# Patient Record
Sex: Female | Born: 2000 | Race: White | Hispanic: No | Marital: Single | Smoking: Never smoker
Health system: Southern US, Community
[De-identification: ages and names within clinical notes are randomized; demographics above are authoritative.]

## PROBLEM LIST (undated history)

## (undated) DIAGNOSIS — F419 Anxiety disorder, unspecified: Secondary | ICD-10-CM

---
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
## 2020-01-12 ENCOUNTER — Ambulatory Visit
Admission: EM | Admit: 2020-01-12 | Discharge: 2020-01-12 | Disposition: A | Discharge status: Home / Self Care | Payer: BC Managed Care – PPO | Attending: Sports Medicine | Admitting: Sports Medicine

## 2020-01-12 ENCOUNTER — Other Ambulatory Visit: Payer: Self-pay

## 2020-01-12 ENCOUNTER — Encounter: Payer: Self-pay | Admitting: Emergency Medicine

## 2020-01-12 ENCOUNTER — Ambulatory Visit: Payer: Self-pay

## 2020-01-12 DIAGNOSIS — T7840XA Allergy, unspecified, initial encounter: Secondary | ICD-10-CM | POA: Diagnosis not present

## 2020-01-12 DIAGNOSIS — R21 Rash and other nonspecific skin eruption: Secondary | ICD-10-CM

## 2020-01-12 HISTORY — DX: Anxiety disorder, unspecified: F41.9

## 2020-01-12 MED ORDER — PREDNISONE 10 MG (21) PO TBPK: ORAL_TABLET | Freq: Every day | ORAL | 0 refills | Status: DC

## 2020-01-12 MED ORDER — CETIRIZINE HCL 10 MG PO TABS: 10.0000 mg | ORAL_TABLET | Freq: Every day | ORAL | 0 refills | Status: DC

## 2020-01-12 NOTE — ED Triage Notes (Signed)
Patient c/o itchy rash that started on Sunday. Denies any other symptoms. States she has been taking Benadryl.

## 2020-01-12 NOTE — ED Provider Notes (Signed)
[facility]    CSN: [id number] Arrival date & time: 01/12/20  1846      History   Chief Complaint Chief Complaint  Patient presents with  . Rash    HPI Judith Marquez is a 19 y.o. female.   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  She reports that she has had a rash all over her body but worse on her arms and her legs.  It has been responding to Benadryl in terms of the itchiness.  That said the rash has not improved.  She denies any dietary changes.  She denies any food allergies.  She denies any changes in detergents soaps or perfumes.  She does have some baseline anxiety and takes sertraline but no new medications.  She denies any throat tightening chest pain or shortness of breath.  No red flag signs or symptoms offered.     Past Medical History:  Diagnosis Date  . Anxiety     There are no problems to display for this patient.   History reviewed. No pertinent surgical history.  OB History   No obstetric history on file.      Home Medications    Prior to Admission medications   Medication Sig Start Date End Date Taking? Authorizing Provider  albuterol (PROVENTIL HFA;VENTOLIN HFA) 108 (90 BASE) MCG/ACT inhaler Inhale 2 puffs into the lungs every 6 (six) hours as needed for wheezing or shortness of breath. 06/10/14   Minna Antis, MD  cetirizine (ZYRTEC ALLERGY) 10 MG tablet Take 1 tablet (10 mg total) by mouth daily. 01/12/20   Delton See, MD  predniSONE (STERAPRED UNI-PAK 21 TAB) 10 MG (21) TBPK tablet Take by mouth daily. Take 6 tabs by mouth daily  for 2 days, then 5 tabs for 2 days, then 4 tabs for 2 days, then 3 tabs for 2 days, 2 tabs for 2 days, then 1 tab by mouth daily for 2 days 01/12/20   Delton See, MD  sertraline (ZOLOFT) 50 MG tablet Take 50 mg by mouth daily.    [provider]     Family History History reviewed. No pertinent family history.  Social History Social History   Tobacco Use  . Smoking status: Never Smoker  . Smokeless tobacco: Never Used  Vaping Use  . Vaping Use: Never used  Substance Use Topics  . Alcohol use: No  . Drug use: Never     Allergies   Patient has no known allergies.   Review of Systems Review of Systems  Constitutional: Negative for activity change, appetite change, chills, fatigue and fever.  HENT: Negative for ear discharge, ear pain and sinus pressure.   Eyes: Negative for pain.  Respiratory: Negative for cough and chest tightness.   Cardiovascular: Negative for chest pain.  Gastrointestinal: Negative for abdominal pain.  Genitourinary: Negative for dysuria.  Skin: Positive for rash. Negative for color change, pallor and wound.  Allergic/Immunologic: Negative for environmental allergies, food allergies and immunocompromised state.     Physical Exam Triage Vital Signs ED Triage Vitals  Enc Vitals Group     BP 01/12/20 1929 (!) 102/53     Pulse Rate 01/12/20 1929 66     Resp 01/12/20 1929 18     Temp 01/12/20 1929 98.1 F (36.7 C)     Temp Source 01/12/20 1929 Oral  SpO2 01/12/20 1929 100 %     Weight 01/12/20 1927 146 lb (66.2 kg)     Height 01/12/20 1927 5\' 9"  (1.753 m)     Head Circumference --      Peak Flow --      Pain Score 01/12/20 1927 0     Pain Loc --      Pain Edu? --      Excl. in GC? --    No data found.  Updated Vital Signs BP (!) 102/53 (BP Location: Left Arm)   Pulse 66   Temp 98.1 F (36.7 C) (Oral)   Resp 18   Ht 5\' 9"  (1.753 m)   Wt 66.2 kg   LMP 01/11/2020   SpO2 100%   BMI 21.56 kg/m   Visual Acuity Right Eye Distance:   Left Eye Distance:   Bilateral Distance:    Right Eye Near:   Left Eye Near:    Bilateral Near:     Physical Exam Vitals and nursing note reviewed.  Constitutional:      General: She is not in acute distress.    Appearance: Normal  appearance. She is normal weight. She is not ill-appearing, toxic-appearing or diaphoretic.  HENT:     Head: Normocephalic and atraumatic.     Nose: Nose normal.  Eyes:     Extraocular Movements: Extraocular movements intact.     Pupils: Pupils are equal, round, and reactive to light.  Cardiovascular:     Rate and Rhythm: Normal rate.     Pulses: Normal pulses.     Heart sounds: Normal heart sounds. No murmur heard. No friction rub. No gallop.   Pulmonary:     Breath sounds: Normal breath sounds.  Musculoskeletal:     Cervical back: Normal range of motion and neck supple.  Skin:    General: Skin is warm and dry.     Coloration: Skin is not jaundiced or pale.     Findings: Rash present. No bruising.     Comments: Patient has a rash on the upper and lower extremities.  She has associated excoriations.  Seems consistent with allergic reaction.  No active infection or drainage.  Neurological:     Mental Status: She is alert.      UC Treatments / Results  Labs (all labs ordered are listed, but only abnormal results are displayed) Labs Reviewed - No data to display  EKG   Radiology No results found.  Procedures Procedures (including critical care time)  Medications Ordered in UC Medications - No data to display  Initial Impression / Assessment and Plan / UC Course  I have reviewed the triage vital signs and the nursing notes.  Pertinent labs & imaging results that were available during my care of the patient were reviewed by me and considered in my medical decision making (see chart for details).  Clinical impression: Global rash mostly on the upper and lower extremities consistent with an allergic reaction.  She also has excoriations from pruritus and scratching.  No active infection.  Etiology not known.  Treatment plan: 1.  The findings and treatment plan were discussed in detail with the patient.  Patient was in agreement. 2.  I recommended a Sterapred Dosepak.  She  will start that tomorrow as it is fairly late in the evening. 3.  Also recommended Zyrtec gave her prescription but it may not be covered by her insurance that she get that over-the-counter. 4.  Continue with the Benadryl for  the itchiness.  She can also use calamine lotion, oatmeal, or Benadryl cream. 5.  Of asked her to keep a food log as well as a log of any detergents, soaps, etc. just in case this does not improve and she needs to see an allergist or dermatology. 6.  Follow-up as needed.     Final Clinical Impressions(s) / UC Diagnoses   Final diagnoses:  Rash  Allergic reaction, initial encounter     Discharge Instructions     I recommended a Sterapred Dosepak.  She will start that tomorrow as it is fairly late in the evening. Also recommended Zyrtec gave her prescription but it may not be covered by her insurance that she get that over-the-counter. Continue with the Benadryl for the itchiness.  She can also use calamine lotion, oatmeal, or Benadryl cream. Of asked her to keep a food log as well as a log of any detergents, soaps, etc. just in case this does not improve and she needs to see an allergist or dermatology. Follow-up as needed.    ED Prescriptions    Medication Sig Dispense Auth. Provider   predniSONE (STERAPRED UNI-PAK 21 TAB) 10 MG (21) TBPK tablet Take by mouth daily. Take 6 tabs by mouth daily  for 2 days, then 5 tabs for 2 days, then 4 tabs for 2 days, then 3 tabs for 2 days, 2 tabs for 2 days, then 1 tab by mouth daily for 2 days 42 tablet Delton See, MD   cetirizine (ZYRTEC ALLERGY) 10 MG tablet Take 1 tablet (10 mg total) by mouth daily. 30 tablet Delton See, MD     PDMP not reviewed this encounter.   Delton See, MD 01/12/20 2100

## 2020-01-12 NOTE — Discharge Instructions (Addendum)
I recommended a Sterapred Dosepak.  She will start that tomorrow as it is fairly late in the evening. Also recommended Zyrtec gave her prescription but it may not be covered by her insurance that she get that over-the-counter. Continue with the Benadryl for the itchiness.  She can also use calamine lotion, oatmeal, or Benadryl cream. Of asked her to keep a food log as well as a log of any detergents, soaps, etc. just in case this does not improve and she needs to see an allergist or dermatology. Follow-up as needed.

## 2020-02-29 ENCOUNTER — Encounter: Payer: Self-pay | Admitting: Emergency Medicine

## 2020-08-13 IMAGING — CR DG FOOT COMPLETE 3+V*R*
3 series · 3 of 3 positions shown · non-contrast
Comparison: None.

CLINICAL DATA: Right lateral and plantar foot pain for the past 4
days. The patient thinks that this may be running related.

EXAM:
RIGHT FOOT COMPLETE - 3+ VIEW

[foot ap]
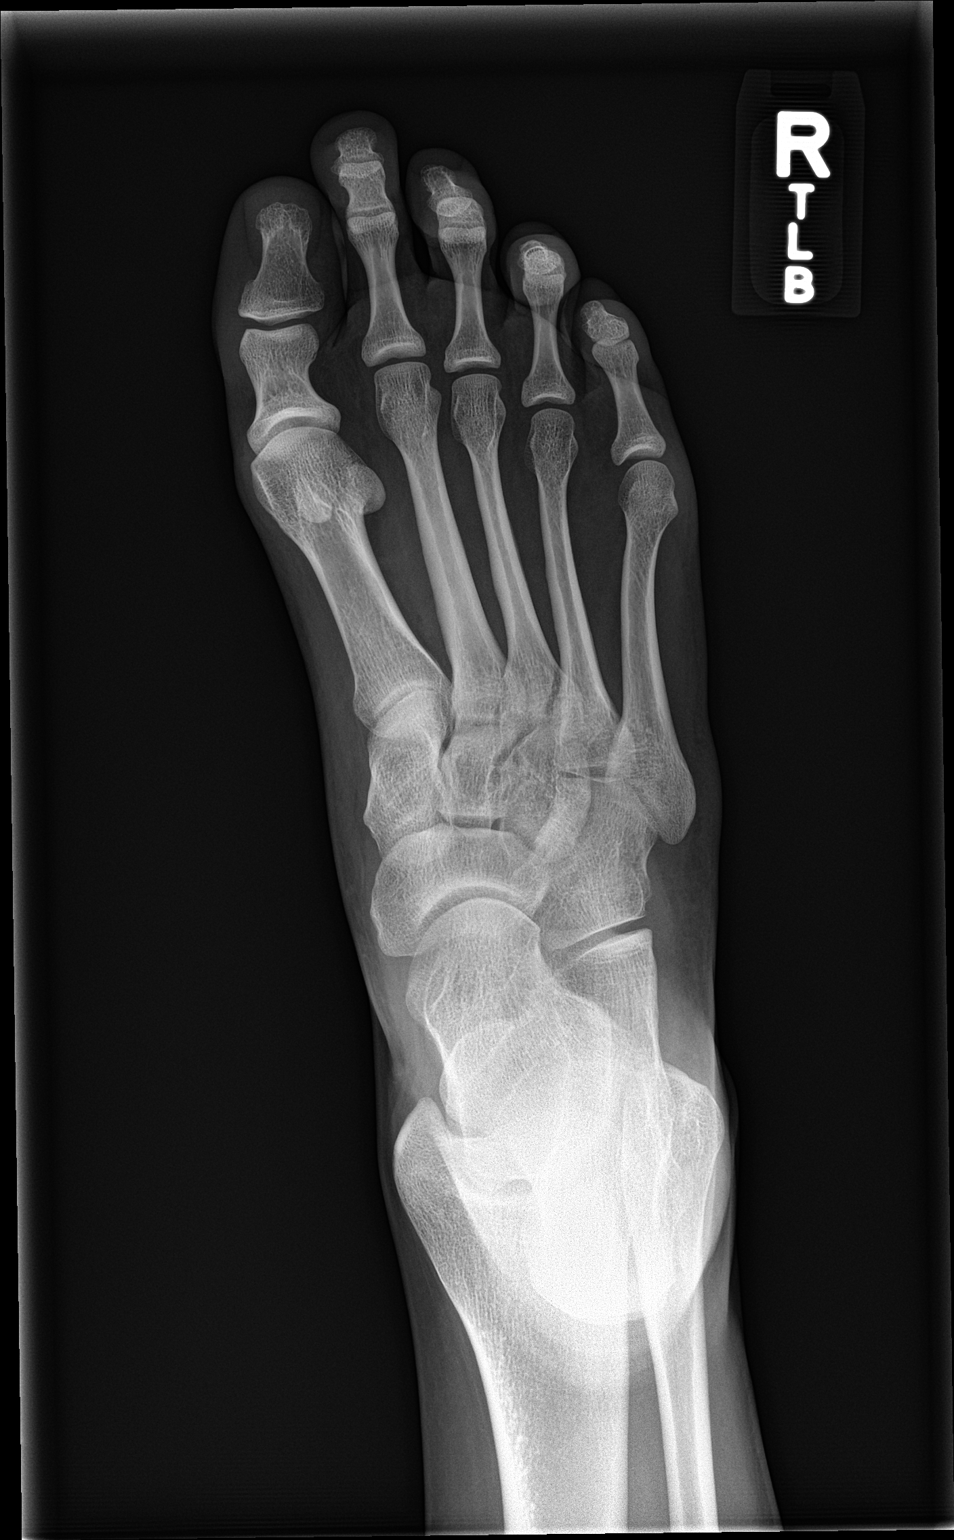

[foot obl]
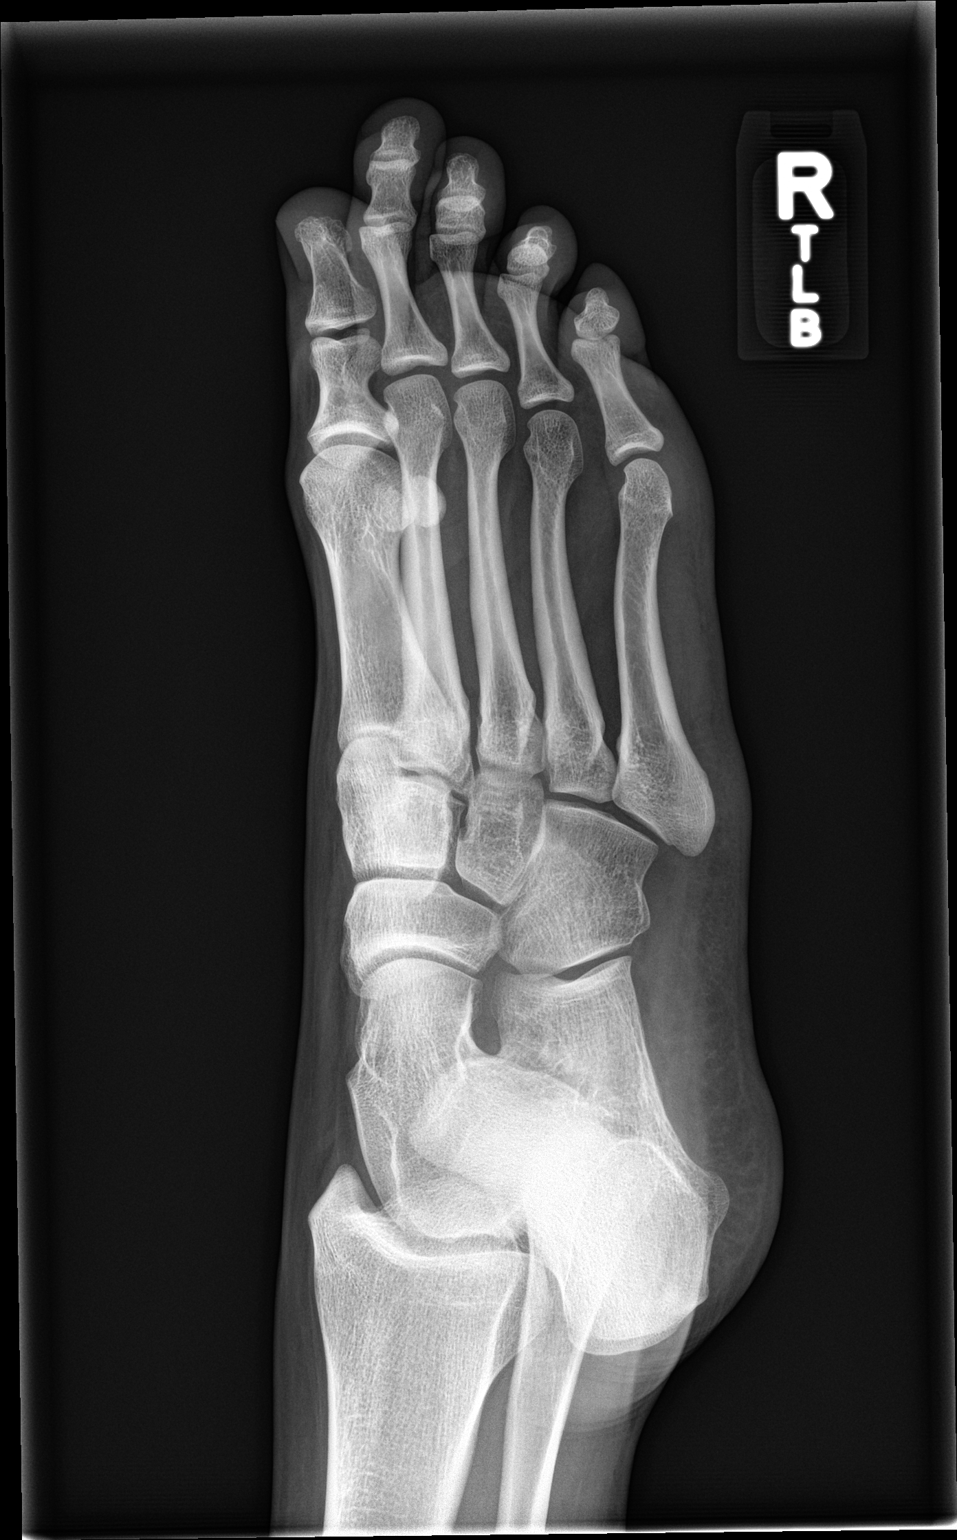

[foot lat]
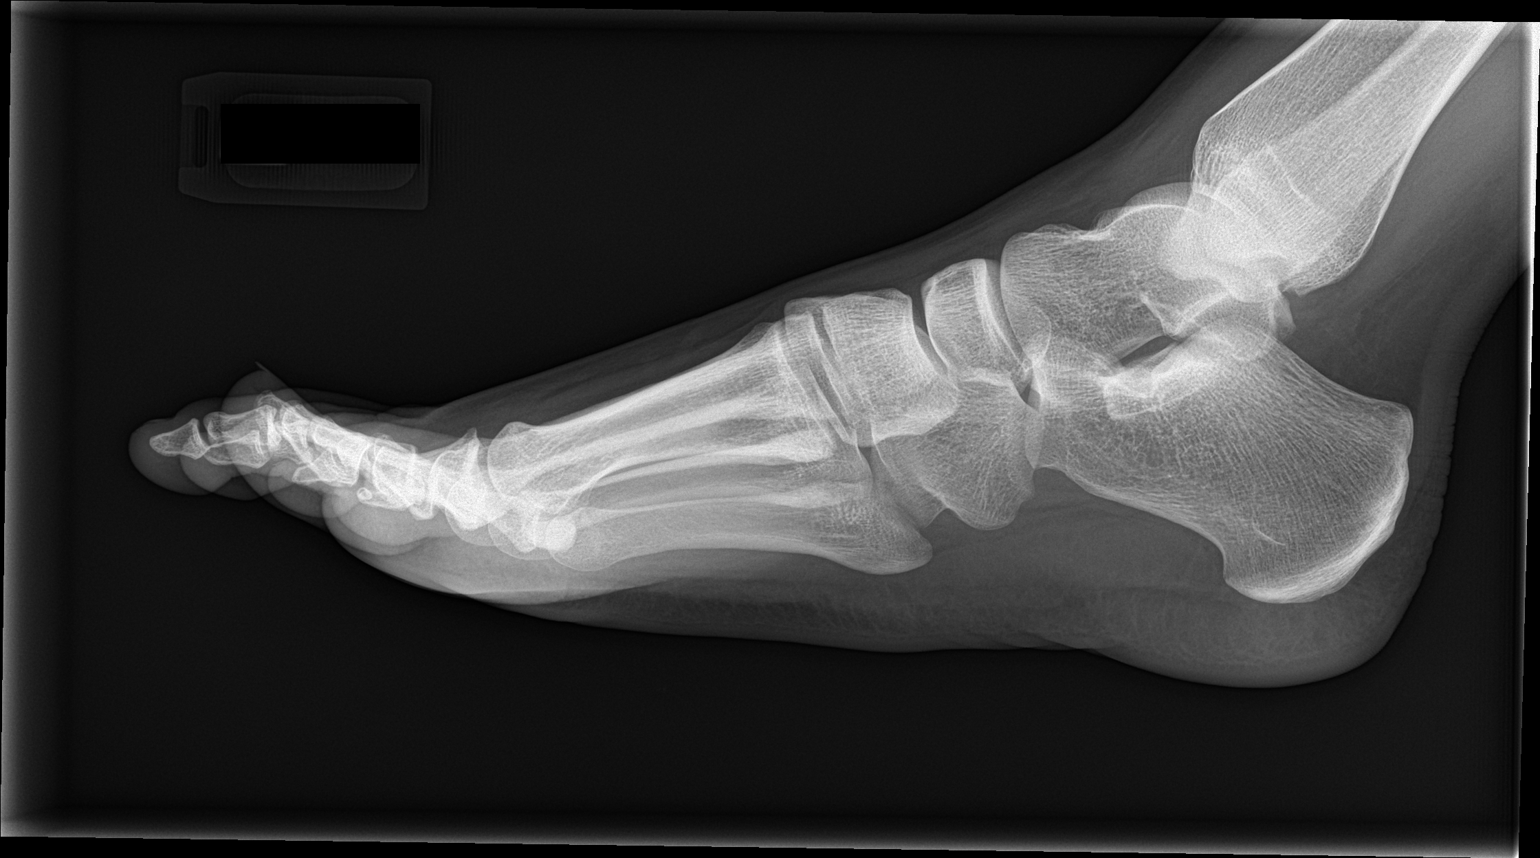

[3 of 3 positions shown; findings below may reference images not displayed]

FINDINGS: There is no evidence of fracture or dislocation. There is no
evidence of arthropathy or other focal bone abnormality. Soft
tissues are unremarkable.
IMPRESSION: Normal examination.

## 2020-12-08 ENCOUNTER — Ambulatory Visit: Admit: 2020-12-08 | Payer: BC Managed Care – PPO

## 2020-12-12 ENCOUNTER — Other Ambulatory Visit: Payer: Self-pay

## 2020-12-12 ENCOUNTER — Ambulatory Visit
Admission: EM | Admit: 2020-12-12 | Discharge: 2020-12-12 | Disposition: A | Discharge status: Home / Self Care | Payer: BC Managed Care – PPO | Attending: Physician Assistant | Admitting: Physician Assistant

## 2020-12-12 DIAGNOSIS — R051 Acute cough: Secondary | ICD-10-CM | POA: Insufficient documentation

## 2020-12-12 DIAGNOSIS — Z20822 Contact with and (suspected) exposure to covid-19: Secondary | ICD-10-CM | POA: Insufficient documentation

## 2020-12-12 DIAGNOSIS — J029 Acute pharyngitis, unspecified: Secondary | ICD-10-CM

## 2020-12-12 DIAGNOSIS — J101 Influenza due to other identified influenza virus with other respiratory manifestations: Secondary | ICD-10-CM | POA: Insufficient documentation

## 2020-12-12 LAB — GROUP A STREP BY PCR: Group A Strep by PCR: NOT DETECTED

## 2020-12-12 LAB — RESP PANEL BY RT-PCR (FLU A&B, COVID) ARPGX2
Influenza A by PCR: POSITIVE — AB
Influenza B by PCR: NEGATIVE
SARS Coronavirus 2 by RT PCR: NEGATIVE

## 2020-12-12 MED ORDER — OSELTAMIVIR PHOSPHATE 75 MG PO CAPS: 75.0000 mg | ORAL_CAPSULE | Freq: Two times a day (BID) | ORAL | 0 refills | Status: AC

## 2020-12-12 MED ORDER — LIDOCAINE VISCOUS HCL 2 % MT SOLN: 15.0000 mL | OROMUCOSAL | 0 refills | Status: AC | PRN

## 2020-12-12 NOTE — ED Triage Notes (Addendum)
Pt states started with scratchy throat on Saturday and progressively got worse to where she feels sharp pain in throat all the time. Wants strep test and flu. Started with cough and runny nose started on Sunday. Temp has been 99.7 range. Negative COVID test at home

## 2020-12-12 NOTE — Discharge Instructions (Signed)
-  You are positive for the flu.  I sent Tamiflu to pharmacy.  Hopefully this improves her symptoms and shorten the course by a day or so. - Care is still supported with increasing rest and fluids, Tylenol or Motrin for discomfort.  I sent viscous lidocaine to pharmacy to help with the sore throat.  You can also use you Chloraseptic spray and cough drops.  Robitussin or Mucinex for cough if needed. - Most people feel better within a week or so. - For any severe acute symptoms that are worsening should be seen again.

## 2020-12-12 NOTE — ED Provider Notes (Signed)
[facility]    CSN: [id number] Arrival date & time: 12/12/20  1015      History   Chief Complaint Chief Complaint  Patient presents with   Sore Throat    HPI Judith Marquez is a 20 y.o. female presenting for 2-day history of sore throat, congestion, cough, fatigue, body aches and low-grade temps.  Patient says she believes she may have a cold but wants to get checked out.  Has been taking over-the-counter Tylenol and Motrin.  Patient was bothered by her sore throat.  Denies any sick contacts or known exposure to influenza or COVID-19.  Does not report any breathing difficulty, nausea/vomiting or diarrhea.  Patient otherwise healthy.  No other complaints.  HPI  Past Medical History:  Diagnosis Date   Anxiety     There are no problems to display for this patient.   History reviewed. No pertinent surgical history.  OB History   No obstetric history on file.      Home Medications    Prior to Admission medications   Medication Sig Start Date End Date Taking? Authorizing Provider  lidocaine (XYLOCAINE) 2 % solution Use as directed 15 mLs in the mouth or throat every 3 (three) hours as needed for mouth pain (swish and spit). 12/12/20  Yes Shirlee Latch, PA-C  oseltamivir (TAMIFLU) 75 MG capsule Take 1 capsule (75 mg total) by mouth every 12 (twelve) hours for 5 days. 12/12/20 12/17/20 Yes Eusebio Friendly B, PA-C  albuterol (PROVENTIL HFA;VENTOLIN HFA) 108 (90 BASE) MCG/ACT inhaler Inhale 2 puffs into the lungs every 6 (six) hours as needed for wheezing or shortness of breath. 06/10/14   Minna Antis, MD  busPIRone (BUSPAR) 10 MG tablet Take 10 mg by mouth 2 (two) times daily. 08/02/20   [provider]  sertraline (ZOLOFT) 50 MG tablet Take 50 mg by mouth daily.    [provider]    Family History History reviewed. No pertinent family history.  Social History Social History   Tobacco Use   Smoking status: Never   Smokeless  tobacco: Never  Vaping Use   Vaping Use: Never used  Substance Use Topics   Alcohol use: No   Drug use: Never     Allergies   Amoxicillin   Review of Systems Review of Systems  Constitutional:  Positive for fatigue. Negative for chills, diaphoresis and fever.  HENT:  Positive for congestion, rhinorrhea and sore throat. Negative for ear pain, sinus pressure and sinus pain.   Respiratory:  Positive for cough. Negative for shortness of breath.   Gastrointestinal:  Negative for abdominal pain, nausea and vomiting.  Musculoskeletal:  Positive for myalgias. Negative for arthralgias.  Skin:  Negative for rash.  Neurological:  Negative for weakness and headaches.  Hematological:  Negative for adenopathy.    Physical Exam Triage Vital Signs ED Triage Vitals  Enc Vitals Group     BP 12/12/20 1111 100/71     Pulse Rate 12/12/20 1111 88     Resp 12/12/20 1111 16     Temp 12/12/20 1111 98.2 F (36.8 C)     Temp Source 12/12/20 1111 Oral     SpO2 12/12/20 1111 100 %     Weight 12/12/20 1110 150 lb (68 kg)     Height 12/12/20 1110 5\' 10"  (1.778 m)     Head Circumference --      Peak Flow --      Pain Score 12/12/20 1109 6  Pain Loc --      Pain Edu? --      Excl. in GC? --    No data found.  Updated Vital Signs BP 100/71 (BP Location: Left Arm)   Pulse 88   Temp 98.2 F (36.8 C) (Oral)   Resp 16   Ht 5\' 10"  (1.778 m)   Wt 150 lb (68 kg)   LMP 12/06/2020   SpO2 100%   BMI 21.52 kg/m   Physical Exam Vitals and nursing note reviewed.  Constitutional:      General: She is not in acute distress.    Appearance: Normal appearance. She is well-developed. She is ill-appearing. She is not toxic-appearing.  HENT:     Head: Normocephalic and atraumatic.     Nose: Congestion present.     Mouth/Throat:     Mouth: Mucous membranes are moist.     Pharynx: Oropharynx is clear. Posterior oropharyngeal erythema present.  Eyes:     General: No scleral icterus.       Right  eye: No discharge.        Left eye: No discharge.     Conjunctiva/sclera: Conjunctivae normal.  Cardiovascular:     Rate and Rhythm: Normal rate and regular rhythm.     Heart sounds: Normal heart sounds.  Pulmonary:     Effort: Pulmonary effort is normal. No respiratory distress.     Breath sounds: Normal breath sounds.  Musculoskeletal:     Cervical back: Neck supple.  Skin:    General: Skin is dry.  Neurological:     General: No focal deficit present.     Mental Status: She is alert. Mental status is at baseline.     Motor: No weakness.     Gait: Gait normal.  Psychiatric:        Mood and Affect: Mood normal.        Behavior: Behavior normal.        Thought Content: Thought content normal.     UC Treatments / Results  Labs (all labs ordered are listed, but only abnormal results are displayed) Labs Reviewed  RESP PANEL BY RT-PCR (FLU A&B, COVID) ARPGX2 - Abnormal; Notable for the following components:      Result Value   Influenza A by PCR POSITIVE (*)    All other components within normal limits  GROUP A STREP BY PCR    EKG   Radiology No results found.  Procedures Procedures (including critical care time)  Medications Ordered in UC Medications - No data to display  Initial Impression / Assessment and Plan / UC Course  I have reviewed the triage vital signs and the nursing notes.  Pertinent labs & imaging results that were available during my care of the patient were reviewed by me and considered in my medical decision making (see chart for details).   20 year old female presenting for 2-day history of sore throat, cough, congestion, fatigue, body aches.  Vitals normal and stable today but patient has been taking antipyretics.  She is mildly ill-appearing but nontoxic.  Exam severe for nasal congestion and posterior pharyngeal erythema.  Chest clear auscultation.  Respiratory panel obtained as well as PCR strep test.  Positive for influenza A.  Discussed all  results with patient.  She would like to try Tamiflu so I have sent it to pharmacy.  Additionally I have sent discussed lidocaine.  Supportive care encouraged.  Reviewed return to the ED precautions and note given for work.  Final Clinical Impressions(s) / UC Diagnoses   Final diagnoses:  Influenza A  Sore throat  Acute cough     Discharge Instructions      -You are positive for the flu.  I sent Tamiflu to pharmacy.  Hopefully this improves her symptoms and shorten the course by a day or so. - Care is still supported with increasing rest and fluids, Tylenol or Motrin for discomfort.  I sent viscous lidocaine to pharmacy to help with the sore throat.  You can also use you Chloraseptic spray and cough drops.  Robitussin or Mucinex for cough if needed. - Most people feel better within a week or so. - For any severe acute symptoms that are worsening should be seen again.     ED Prescriptions     Medication Sig Dispense Auth. Provider   oseltamivir (TAMIFLU) 75 MG capsule Take 1 capsule (75 mg total) by mouth every 12 (twelve) hours for 5 days. 10 capsule Eusebio Friendly B, PA-C   lidocaine (XYLOCAINE) 2 % solution Use as directed 15 mLs in the mouth or throat every 3 (three) hours as needed for mouth pain (swish and spit). 100 mL Shirlee Latch, PA-C      PDMP not reviewed this encounter.   Shirlee Latch, PA-C 12/12/20 1226

## 2021-08-18 ENCOUNTER — Telehealth: Payer: BC Managed Care – PPO | Admitting: Physician Assistant

## 2021-08-18 NOTE — Progress Notes (Signed)
Patient has presented for E-Visit but is not in [place].  She will be directed to [facility] or local urgent care.  Jarold Motto PA-C

## 2022-03-18 NOTE — Telephone Encounter (Signed)
Msg left with answering service 2/23 requesting new pt appt.  -lvmcb

## 2023-02-14 ENCOUNTER — Observation Stay
Admission: EM | Admit: 2023-02-14 | Discharge: 2023-02-16 | Disposition: A | Discharge status: Home / Self Care | Payer: PRIVATE HEALTH INSURANCE | Admitting: Hospitalist

## 2023-02-14 ENCOUNTER — Emergency Department: Admit: 2023-02-14 | Payer: PRIVATE HEALTH INSURANCE | Primary: Internal Medicine

## 2023-02-14 DIAGNOSIS — R1031 Right lower quadrant pain: Secondary | ICD-10-CM

## 2023-02-14 DIAGNOSIS — K529 Noninfective gastroenteritis and colitis, unspecified: Secondary | ICD-10-CM

## 2023-02-14 LAB — CBC WITH DIFFERENTIAL
Basophils %: 0.3 %
Basophils Absolute: 0.04 10*3/uL (ref 0.00–0.22)
Eosinophils %: 0.4 %
Eosinophils Absolute: 0.05 10*3/uL (ref 0.00–0.50)
Hematocrit: 39.3 % (ref 32.0–47.0)
Hemoglobin: 13.1 g/dL (ref 11.0–16.0)
Immature Granulocytes %: 0.3 %
Immature Granulocytes Absolute: 0.04 10*3/uL (ref 0.00–0.10)
Lymphocyte %: 13.7 %
Lymphocytes Absolute: 1.82 10*3/uL (ref 0.70–4.00)
MCH: 28.8 pg (ref 26.0–34.0)
MCHC: 33.3 g/dL (ref 31.0–37.0)
MCV: 86.4 fL (ref 80.0–100.0)
MPV: 9.9 fL (ref 9.1–12.4)
Monocytes %: 9.4 %
Monocytes Absolute: 1.25 10*3/uL — ABNORMAL HIGH (ref 0.36–0.77)
NRBC %: 0 % (ref 0.0–0.0)
NRBC Absolute: 0 10*3/uL (ref 0.00–2.00)
Neutrophil %: 75.9 %
Neutrophils Absolute: 10.12 10*3/uL — ABNORMAL HIGH (ref 1.50–7.95)
Platelets: 234 10*3/uL (ref 150–400)
RBC: 4.55 M/uL (ref 3.70–5.20)
RDW-CV: 13.1 % (ref 11.5–14.5)
RDW-SD: 41.1 fL (ref 35.0–51.0)
WBC: 13.3 10*3/uL — ABNORMAL HIGH (ref 4.0–11.0)

## 2023-02-14 LAB — COMPREHENSIVE METABOLIC PANEL
ALT: 23 U/L (ref 0–55)
AST: 21 U/L (ref 6–42)
Albumin: 3.3 g/dL (ref 3.2–5.0)
Alkaline phosphatase: 56 U/L (ref 30–130)
Anion Gap: 9 mmol/L (ref 3–14)
BUN: 7 mg/dL (ref 6–24)
Bilirubin, total: 0.2 mg/dL (ref 0.2–1.2)
CO2 (Bicarbonate): 25 mmol/L (ref 20–32)
Calcium: 9 mg/dL (ref 8.5–10.5)
Chloride: 100 mmol/L (ref 98–110)
Creatinine: 0.69 mg/dL (ref 0.55–1.30)
Glucose: 100 mg/dL (ref 70–139)
Potassium: 4.1 mmol/L (ref 3.6–5.2)
Protein, total: 7 g/dL (ref 6.0–8.4)
Sodium: 134 mmol/L — ABNORMAL LOW (ref 135–146)
eGFRcr: 125 mL/min/{1.73_m2} (ref >=60)

## 2023-02-14 LAB — LIPASE: Lipase: 24 U/L (ref 13–75)

## 2023-02-14 LAB — HCG, QUANTITATIVE, PREGNANCY: hCG, serum, quantitative: <1 m[IU]/mL (ref <=5)

## 2023-02-14 MED ORDER — ketorolac (Toradol) injection 15 mg
15 | Freq: Once | INTRAMUSCULAR | Status: AC
Start: 2023-02-14 — End: 2023-02-14
  Administered 2023-02-15: 01:00:00 15 mg via INTRAVENOUS

## 2023-02-14 MED ORDER — iohexol (OMNIPaque) 350 mg iodine/mL solution 100 mL
350 | Freq: Once | INTRAVENOUS | Status: AC
Start: 2023-02-14 — End: 2023-02-14
  Administered 2023-02-14: 22:00:00 100 mL via INTRAVENOUS

## 2023-02-14 MED ORDER — HYDROmorphone (Dilaudid) injection 0.5 mg
0.5 | Freq: Once | INTRAMUSCULAR | Status: AC
Start: 2023-02-14 — End: 2023-02-14
  Administered 2023-02-14: 23:00:00 0.5 mg via INTRAVENOUS

## 2023-02-14 MED ORDER — sodium chloride 0.9 % bolus 1,000 mL
0.9 | Freq: Once | INTRAVENOUS | Status: AC
Start: 2023-02-14 — End: 2023-02-14
  Administered 2023-02-14: 23:00:00 1000 mL via INTRAVENOUS

## 2023-02-14 MED ORDER — HYDROmorphone (Dilaudid) injection 0.5 mg
0.5 | Freq: Once | INTRAMUSCULAR | Status: AC
Start: 2023-02-14 — End: 2023-02-14
  Administered 2023-02-15: 02:00:00 0.5 mg via INTRAVENOUS

## 2023-02-14 MED ORDER — ondansetron (Zofran) injection 4 mg
4 | Freq: Once | INTRAMUSCULAR | Status: AC
Start: 2023-02-14 — End: 2023-02-14
  Administered 2023-02-14: 21:00:00 4 mg via INTRAVENOUS

## 2023-02-14 MED ORDER — HYDROmorphone (Dilaudid) injection 0.5 mg
0.5 | Freq: Once | INTRAMUSCULAR | Status: DC
Start: 2023-02-14 — End: 2023-02-14

## 2023-02-14 MED ORDER — metroNIDAZOLE in NaCl (iso-os) (Flagyl) IVPB 500 mg
500 | Freq: Once | INTRAVENOUS | Status: AC
Start: 2023-02-14 — End: 2023-02-14
  Administered 2023-02-14: 500 mg via INTRAVENOUS

## 2023-02-14 MED ORDER — cefTRIAXone (Rocephin) 1 g in sodium chloride 0.9 % 100 mL IVPB-MBP
1 | Freq: Once | INTRAMUSCULAR | Status: AC
Start: 2023-02-14 — End: 2023-02-14
  Administered 2023-02-14: 23:00:00 1 g via INTRAVENOUS

## 2023-02-14 MED ORDER — ketorolac (Toradol) injection 15 mg
15 | Freq: Once | INTRAMUSCULAR | Status: AC
Start: 2023-02-14 — End: 2023-02-14
  Administered 2023-02-14: 21:00:00 15 mg via INTRAVENOUS

## 2023-02-14 MED FILL — CEFTRIAXONE IVPB 1 G MINI-BAG PLUS IN 100 ML NS: 1.0000 1.0000 g | Qty: 1

## 2023-02-14 MED FILL — KETOROLAC 15 MG/ML INJECTION SOLUTION: 15 15 mg/mL | INTRAMUSCULAR | Qty: 1

## 2023-02-14 MED FILL — HYDROMORPHONE 0.5 MG/0.5 ML INJECTION WRAPPER: 0.5 0.5 mg/0.5 mL | INTRAMUSCULAR | Qty: 0.5

## 2023-02-14 MED FILL — ONDANSETRON HCL (PF) 4 MG/2 ML INJECTION SOLUTION: 4 4 mg/2 mL | INTRAMUSCULAR | Qty: 2

## 2023-02-14 MED FILL — METRONIDAZOLE 500 MG/100 ML IN SODIUM CHLOR(ISO) INTRAVENOUS PIGGYBACK: 500 500 mg/100 mL | INTRAVENOUS | Qty: 100

## 2023-02-14 NOTE — ED Triage Notes (Signed)
Pt complaining of RLQ abdominal pain. Pt states it started 2 days ago. Pt states she is unable to stand up right and very tender to the touch.

## 2023-02-14 NOTE — ED Progress Note (Signed)
MEDICAL SCREENING EXAM - PROVIDER IN TRIAGE    Brief HPI: 23 year old female presents ED today with right lower quadrant abdominal pain, fevers, chills, nausea since yesterday.  Maximum temperature 100.8 F.  Was seen at outside urgent care this morning and tested negative for COVID and flu.  There was concern for appendicitis and patient sent to ED for management.  Patient denies urinary symptoms.  She is on continuous OCP, denies concern for pregnancy.  Reports normal bowel movements.    Exam:  Constitutional: Vital signs reviewed.  Appears uncomfortable, leaning forward and holding her lower abdomen.  Tachycardic with a pulse of 129.  Respiratory: Nonlabored. Speaking in full clear sentences.   Skin: Normal color. No rash.  Neuro: Alert. Normal gross motor.  Psych. Normal affect    Tests ordered:   Orders Placed This Encounter   Procedures    CT ABDOMEN PELVIS W CONTRAST    CBC and differential    Comprehensive metabolic panel    hCG, Quantitative    Lipase    Urine Testing (UA, UARC, Urine Culture)    CBC w/ Differential    Urinalysis reflex to culture    Insert peripheral IV   Ordered for 1 L of normal saline, Toradol and Zofran    Disposition: Main ED    Patient was advised not to leave the emergency department until further evaluation and work-up is complete.     Cherlynn Kaiser, PA  02/14/23 [phone]

## 2023-02-14 NOTE — ED Progress Note (Signed)
Assumed care of the patient at 1800 from [facility].    Brief HPI: 23 year old female presents ED today with right lower quadrant abdominal pain, fevers, chills, nausea since yesterday.     Disposition: Upon assuming care of the patient, patient was pending surgical consult at bedside.  Surgery resident Dr. Marlyne Beards recommended admission to medicine.  Patient admitted to medicine under Dr. Lezlie Lye.     [place], [place]  02/14/23 [phone]

## 2023-02-14 NOTE — ED Notes (Signed)
Pt reports pain increase to 9/10 following Toradol administration, page to Dr. Louis Meckel, medications ordered, see MAR.     Larita Fife, RN  02/14/23 2038

## 2023-02-14 NOTE — ED Notes (Signed)
Pt reports pain decreased to 4/10 following Dilaudid,now states pain is beginning to increase to 6/10. Dr. Louis Meckel aware and at bedside.     Larita Fife, RN  02/14/23 2157

## 2023-02-14 NOTE — Consults (Signed)
 GENERAL SURGERY CONSULT H&P      Today's Date: 02/14/2023   MRN: [id number]   Name: Ellen Herrera   DOB: [birth year]   ______________________________________  Date of Admission: 02/14/2023   Hospital Day: 0  ______________________________________    HISTORY OF PRESENT ILLNESS:  Ellen Herrera is a 23 y.o. female with no pmh on whom General Surgery has been consulted regarding c/f colitis.    Pt endorsing RLQ pain, fevers, chills, nausea starting Wednesday, which is worsened.  She is endorsing regular BMs and is passing gas, but has very little appetite.  No blood noted in stool.  The pain is diffuse but most severe in the RLQ.  Does not take any medications, besides birth control.  No family history of IBD.  Denies any past surgical history other than wisdom teeth extraction.    Patient is hemodynamically stable.  Labs notable for leukocytosis of 13.3.  CTAP notable for significant wall thickening of the cecum, proximal ascending colon, terminal ileum, borderline dilated appendix. -COVID/flu. Started on CTX/flagyl in ED.    Patient History   Allergies  Allergies   Allergen Reactions    Amoxicillin         Home Medications  No current outpatient medications     Current Inpatient Medications    Current Facility-Administered Medications:     cefTRIAXone (Rocephin) 1 g in sodium chloride 0.9 % 100 mL IVPB-MBP, 1 g, intravenous, Once, Cherlynn Kaiser, PA    HYDROmorphone (Dilaudid) injection 0.5 mg, 0.5 mg, intravenous, Once, Cherlynn Kaiser, PA    metroNIDAZOLE in NaCl (iso-os) (Flagyl) IVPB 500 mg, 500 mg, intravenous, Once, Cherlynn Kaiser, PA    sodium chloride 0.9 % bolus 1,000 mL, 1,000 mL, intravenous, Once, Cherlynn Kaiser, PA  No current outpatient medications on file.     Past Medical History  Past Medical History:   Diagnosis Date    Anxiety         Past Surgical History  No past surgical history on file.     Family History  No family history on file.     Social History  Social History     Tobacco Use    Smoking  status: Not on file    Smokeless tobacco: Not on file   Substance Use Topics    Alcohol use: Not on file           REVIEW OF SYSTEMS:  GENERAL: No fevers, chills  HEENT: No congestion or rhinorrhea  CV: No chest pain, dyspnea  PULM: No cough, wheezing, cold symptoms  GI: As per HPI  GU: No dysuria  MSK: No new joint pain/swelling  NEURO: No focal weakness  DERM: No new rashes  ID: No recent cold symptoms, recent foreign travel    PHYSICAL EXAM:  Temp:  [36.7 C (98 F)] 36.7 C (98 F)  Pulse:  [129] 129  Resp:  [18] 18  SpO2:  [100 %] 100 %  BP: (105)/(73) 105/73  No intake or output data in the 24 hours ending 02/14/23 1752     GENERAL:  Uncomfortable appearing  HEENT: NCAT, MMM  CARDIAC: NSR on monitor and RRR, radial pulses palpable and symmetric  PULM: No increased work of breathing, no accessory muscle use and comfortable on room air  GI: Soft, nondistended, tender in the RLQ.  No peritoneal signs, no rebound tenderness  GU: Deferred  EXT: Moving all extremities and Warm and well perfused  NEURO: No focal deficits    LABS:  13.3 \  13.1 / 234    / 39.3 \     134 100 7 / 100   4.1 25 0.69 \     -9.0   -No results found for requested labs within last 365 days.   -No results found for requested labs within last 365 days.          IMAGING:  === 02/14/23 ===    CT ABDOMEN PELVIS W CONTRAST    Significant wall thickening of the cecum, proximal ascending colon, and terminal ileum. There is a tubular structure measuring 6 to7 mm in the right lower quadrant near the cecum with intraluminal air, may represent a borderline dilated appendix. Constellation of these findings may represent acute inflammation involving the cecum, proximal right colon, and terminal ileum.    Small amount of free fluid in the pelvis.      ASSESSMENT/RECOMMENDATIONS:  23 y.o.female with no significant pmh on whom general surgery has been consulted for c/f colitis.    Pt is HDS with no peritoneal signs on physical exam or evidence of perforation  on imaging. Admit to medicine and recommend starting abx. No acute surgical intervention indicated at this time. Surgery will continue to follow.    Recommendations:  - admit to medicine  - continue abx    Josefa Half, MD  General Surgery Resident    Discussed with Dr. Altamese Carolina

## 2023-02-14 NOTE — Progress Notes (Signed)
Pharmacy - Admission Medication History and Reconciliation     I reviewed the available medical records, studied information provided by DrFirst / Surescripts and interviewed the patient to conduct a medication history. I reviewed and updated as necessary, the patient's Allergies and Home Medication List. I communicated significant findings directly with a member of the primary team. I allowed the patient to ask questions about their medication therapy and provided answers to their questions.     Findings:  My findings are detailed in the Table at the end of this note.    Recommendations:  N/A      Thank you for allowing me to participate in the care of this patient. Please contact me or any member of the pharmacist staff if you have questions.     Cleon Gustin, PharmD  Available on [employer]      Preferred Pharmacy: (updated by pharmacy for this admission)    [facility] [id number] [place] - [address] AT [facility] [address]  [address]  [place] [address]  Phone: [phone] Fax: [phone]      Prior to Admission medications    Medication Sig Start Date End Date Taking? Authorizing Provider   drospirenone-ethinyl estradioL Dianah Field, Gianvi) 3-0.02 mg tablet Take 1 tablet by mouth once daily.    Historical Provider, MD   hydrOXYzine HCL (Atarax) 25 mg tablet Take 25 mg by mouth if needed each day for itching.    Historical Provider, MD

## 2023-02-14 NOTE — ED Notes (Signed)
Report to Med 4 RN     Larita Fife, RN  02/14/23 2217

## 2023-02-14 NOTE — ED Notes (Signed)
Pt reports increase in abd pain, 5/10 at rest but worsens with movement. Page out to hospitalist regarding pain control.     Larita Fife, RN  02/14/23 [phone]

## 2023-02-14 NOTE — H&P (Signed)
HOSPITALIST ADMISSION NOTE      02/14/2023  8:06 PM    Patient: Ellen Herrera  DOB: [birth year]  PCP: No PCP, Per Patient      CHIEF COMPLAINT:       Abdominal pain    HISTORY OF PRESENT ILLNESS:   Ellen Herrera, 23 year old female without any prior medical history complains of abdominal pain  Abdominal pain for the past 4 days, symptoms initially started with muscle ache/shivering and a low-grade fever followed by abdominal pain, predominantly in the lower part, radiating to the back, initially was 6 x 10, gradually got worse to 9 x 10, sometimes radiates up towards, associated temperature 100.8, denies any nausea vomiting, she initially had symptoms of trapped gas' twice, denies any diarrhea/blood in the stool, headache present, denies any dizziness focal weakness, bleeding in the stool or black stools or hematuria  Patient takes oral contraceptives  Maternal grandmother had pancreatic cancer, is 62 and a survivor    ED course  Vital signs 100/min respiratory 29, 109/73, 100% on room air  Labs hyponatremia 134, potassium 4.1, BUN/creatinine 7/0.69, lactic acid 0.9, normal liver functions, leukocytosis 13.3, 76% neutrophils  Blood culture pending  hCG negative   CT abdomen pelvis with contrast  Significant wall thickening of the cecum, proximal ascending colon, and terminal ileum. There is a tubular structure measuring 6 to7 mm in the right lower quadrant near the cecum with intraluminal air, may represent a borderline dilated appendix. Constellation of these findings may represent acute inflammation involving the cecum, proximal right colon, and terminal ileum.  Small amount of free fluid in the pelvis.    Surgery was consulted in the ED recommended conservative management and no acute surgical intervention at this time    Admitted for colitis    REVIEW OF SYSTEMS:     Constitutional:  Negative for chills and fever.   HENT: Negative for congestion and sinus pressure.    Eyes: Negative.    Respiratory: Negative for  cough, chest tightness, shortness of breath and wheezing.    Cardiovascular: Negative for palpitations and leg swelling.   Gastrointestinal: Negative for abdominal pain and vomiting.   Endocrine: Negative.    Genitourinary: difficulty urinating, frequency and urgency.   Musculoskeletal: Negative for back pain and joint swelling.   Skin: Negative for rash.   Allergic/Immunologic: Negative.    Neurological: Negative for dizziness and headaches.   Hematological: Does not bruise/bleed easily.   Psychiatric/Behavioral: Negative for confusion    MEDICAL & SURGICAL HX:      Past Medical History:   Diagnosis Date    Anxiety      Patient Active Problem List   Diagnosis    Colitis     No past surgical history on file.    FAMILY HX:       No family history on file.    SOCIAL HX:     Social History     Socioeconomic History    Marital status: Single     Spouse name: Not on file    Number of children: Not on file    Years of education: Not on file    Highest education level: Not on file   Occupational History    Not on file   Tobacco Use    Smoking status: Not on file    Smokeless tobacco: Not on file   Substance and Sexual Activity    Alcohol use: Not on file    Drug use: Not on file  Sexual activity: Not on file   Other Topics Concern    Not on file   Social History Narrative    Not on file     Social Determinants of Health     Financial Resource Strain: Low Risk  (07/04/2017)    Received from [facility]    Overall Financial Resource Strain (CARDIA)     Difficulty of Paying Living Expenses: Not hard at all   Food Insecurity: No Food Insecurity (07/04/2017)    Received from [facility]    Hunger Vital Sign     Worried About Running Out of Food in the Last Year: Never true     Ran Out of Food in the Last Year: Never true   Transportation Needs: No Transportation Needs (07/04/2017)    Received from [facility]    PRAPARE - Transportation     Lack of Transportation (Medical): No     Lack of Transportation  (Non-Medical): No   Physical Activity: Not on file   Stress: Not on file   Social Connections: Not on file   Intimate Partner Violence: Not on file   Housing Stability: Not on file       ALLERGIES:      Allergies   Allergen Reactions    Amoxicillin        OUTPATIENT MEDICATION:     No current facility-administered medications on file prior to encounter.     Current Outpatient Medications on File Prior to Encounter   Medication Sig Dispense Refill    drospirenone-ethinyl estradioL (Yaz, Gianvi) 3-0.02 mg tablet Take 1 tablet by mouth once daily.      hydrOXYzine HCL (Atarax) 25 mg tablet Take 25 mg by mouth if needed each day for itching.        I have utilized all available immediate resources to obtain, update, or review the patient's current medications  PHYSICAL EXAMINATION:     Temp:  [36.7 C (98 F)] 36.7 C (98 F)  Pulse:  [89-129] 103  Resp:  [12-18] 15  SpO2:  [100 %] 100 %  BP: (103-107)/(67-77) 105/72    Oxygen Therapy  SpO2: 100 %    No intake or output data in the 24 hours ending 02/14/23 2006     General: Sitting comfortably on the bed, not in distress  HEENT: Normocephalic, atraumatic, PERRLA, oral mucosa moist  Neck: Supple, normal ROM  Respiratory: *CTAB, no wheezing or crackles, not in labored breathing  Cardiovascular: Normal rate and rhythm, no murmur*  Gastrointestinal: Tenderness present in the right lower quadrant, guarding present, no rigidity  Musculoskeletal: ROM intact all extremities, no edema  Integumentary: *No rash, no bruises  Neurologic: AAOx3, GCS 15, CN II to XII intact, normal sensory and motor function  Psychiatric: Calm, cooperative    LABORATORY DATA/IMAGING      Results for orders placed or performed during the hospital encounter of 02/14/23   Comprehensive metabolic panel    Collection Time: 02/14/23  3:55 PM   Result Value Ref Range    Sodium 134 (L) 135 - 146 mmol/L    Potassium 4.1 3.6 - 5.2 mmol/L    Chloride 100 98 - 110 mmol/L    CO2 (Bicarbonate) 25 20 - 32 mmol/L     Anion Gap 9 3 - 14 mmol/L    BUN 7 6 - 24 mg/dL    Creatinine 4.13 2.44 - 1.30 mg/dL    eGFRcr 010 >=27 OZ/DGU/4.40H*4    Glucose 100  70 - 139 mg/dL    Fasting? Unknown     Calcium 9.0 8.5 - 10.5 mg/dL    AST 21 6 - 42 U/L    ALT 23 0 - 55 U/L    Alkaline phosphatase 56 30 - 130 U/L    Protein, total 7.0 6.0 - 8.4 g/dL    Albumin 3.3 3.2 - 5.0 g/dL    Bilirubin, total 0.2 0.2 - 1.2 mg/dL   hCG, Quantitative    Collection Time: 02/14/23  3:55 PM   Result Value Ref Range    hCG, serum, quantitative <1 <=5 mIU/mL   Lipase    Collection Time: 02/14/23  3:55 PM   Result Value Ref Range    Lipase 24 13 - 75 U/L   CBC w/ Differential    Collection Time: 02/14/23  3:55 PM   Result Value Ref Range    WBC 13.3 (H) 4.0 - 11.0 K/uL    RBC 4.55 3.70 - 5.20 M/uL    Hemoglobin 13.1 11.0 - 16.0 g/dL    Hematocrit 16.1 09.6 - 47.0 %    MCV 86.4 80.0 - 100.0 fL    MCH 28.8 26.0 - 34.0 pg    MCHC 33.3 31.0 - 37.0 g/dL    RDW-CV 04.5 40.9 - 81.1 %    RDW-SD 41.1 35.0 - 51.0 fL    Platelets 234 150 - 400 K/uL    MPV 9.9 9.1 - 12.4 fL    Neutrophil % 75.9 %    Lymphocyte % 13.7 %    Monocytes % 9.4 %    Eosinophils % 0.4 %    Basophils % 0.3 %    Immature Granulocytes % 0.3 %    NRBC % 0.0 0.0 - 0.0 %    Neutrophils Absolute 10.12 (H) 1.50 - 7.95 K/uL    Lymphocytes Absolute 1.82 0.70 - 4.00 K/uL    Monocytes Absolute 1.25 (H) 0.36 - 0.77 K/uL    Eosinophils Absolute 0.05 0.00 - 0.50 K/uL    Basophils Absolute 0.04 0.00 - 0.22 K/uL    Immature Granulocytes Absolute 0.04 0.00 - 0.10 K/uL    NRBC Absolute 0.00 0.00 - 2.00 K/uL   Lactic acid    Collection Time: 02/14/23  6:00 PM   Result Value Ref Range    Lactic acid 0.9 0.4 - 2.0 mmol/L       Results       Procedure Component Value Units Date/Time    Blood culture, peripheral #1 [[id number]] Collected: 02/14/23 1800    Order Status: Sent Specimen: Blood, Venous Updated: 02/14/23 1812    Blood culture, peripheral #2 [[id number]] Collected: 02/14/23 1800    Order Status: Sent Specimen: Blood,  Venous Updated: 02/14/23 1812             CT ABDOMEN PELVIS W CONTRAST   Final Result   Significant wall thickening of the cecum, proximal ascending colon, and terminal ileum. There is a tubular structure measuring 6 to7 mm in the right lower quadrant near the cecum with intraluminal air, may represent a borderline dilated appendix. Constellation of these findings may represent acute inflammation involving the cecum, proximal right colon, and terminal ileum.      Small amount of free fluid in the pelvis.      Findings were discussed on 02/14/2023 5:13 PM with Cherlynn Kaiser, PA.          Gaspar Cola MD 02/14/2023 5:19 PM  MEDICAL DECISION MAKING:   MDM Data  Clinical lab tests: reviewed  Tests in the radiology section of CPT: reviewed  Tests in the medicine section of CPT: reviewed  External documents reviewed: ExternalDocuments: Outpatient Notes and Prior H&P, labs  My EKG interpretation: see above  My CT interpretation: see above  My X-ray interpretation: see above  Tests considered but not ordered: not considered  Decision rules/scores evaluated: not applicable  Discussed with: Patient    IMPRESSION/PLAN:   Ms.Belongia, 23 year old female without any prior medical history complains of abdominal pain, fever for 4 days examination shows guarding and tenderness in the right lower quadrant and lumbar region, vital signs hyponatremia normal renal function leukocytosis, CT abdomen pelvis shows thickening of the cecum and proximal colon and terminal ileum tubular structure measuring 6 to 7 mm in the right lower quadrant near the cecum with intraluminal air may represent dilated appendix surgery was consulted in the ED, advised medical management and to hospitalist service    Admitted for colitis    # 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  Differential diagnosis includes disease, infectious etiology  Serum calprotectin, CRP, ESR  Stool cultures  Stool PCR  Lactic acid tomorrow morning  Continue IV  fluids  Continue antibiotics ceftriaxone and metronidazole  Dilaudid 0.5 mg every 8 hrs prn  GI consult  Surgery consult appreciated    # Oral contraceptive use  To continue    DVT Prophylaxis: Heparin every 8 hours  GI prophylaxis:   Diet/Nutrition:   Code Status: Full code    I confirmed today that the patient's Advanced Care Plan is documented in the medical record either by discussing and documenting the patient's Advance Care Plan, confirming that the patient's surrogate decision maker is documented in the medical record, or confirming that the patient's Advanced Care Plan is presently documented.    I have discussed the plan of care with: Patient    I certify that hospital inpatient services are reasonable and medically necessary. They are appropriately provided as inpatient services in accordance with the two midnight benchmark under 42CFR 412 3(e), or the services are specified as inpatient only procedure under 42 CFR 419 22(n).         REASON FOR INPATIENT SERVICES: Abdominal pain, suspected Crohn's   ESTIMATED DURATION OF HOSPITALIZATION: 2 to 3 days    Disclaimer: This note was generated with Dragon Medical voice recognition program.  Please excuse any errors which may have been overlooked during review.  Sometimes, these errors may affect the content or meaning of a given sentence.    Girtha Hake, MD  Attending physician  02/14/2023    Note to patient: The 21st Century cures act makes medical notes available to patients in the interest of transparency. Be advised this is a medical document. It is intended primarily as a Corporate investment banker. It is written in medical language and may contain abbreviations or verbiage that are unfamiliar. It may appear blunt or direct. This is because medical documents are intended to carry relevant information, facts as evident, and the clinical opinion of the physician only as formulated at the time of writing.

## 2023-02-14 NOTE — ED Provider Notes (Signed)
[facility]  [address]  [place] [address]    Chief Complaint   Patient presents with    Abdominal Pain       HISTORY OF PRESENT ILLNESS  23 year old female presents ED today with right lower quadrant abdominal pain, fevers, chills, nausea since yesterday.  Maximum temperature 100.8 F.  Was seen at outside urgent care this morning and tested negative for COVID and flu.  There was concern for appendicitis and patient sent to ED for management.  Patient denies urinary symptoms.  She is on continuous OCP, denies concern for pregnancy.  Reports normal bowel movements.      History provided by:  Patient  Language interpreter used: No      PAST MEDICAL HISTORY  There is no problem list on file for this patient.    Past Medical History:   Diagnosis Date    Anxiety      SURGICAL/FAM/SOCIAL HISTORY  No past surgical history on file.    No family history on file.    Social History     Tobacco Use    Smoking status: Not on file    Smokeless tobacco: Not on file   Substance Use Topics    Alcohol use: Not on file    Drug use: Not on file       MEDICATIONS  Prior to Admission medications    Not on File      Allergies   Allergen Reactions    Amoxicillin      REVIEW OF SYSTEMS  Review of Systems   Constitutional:  Positive for activity change, appetite change, chills and fatigue.   Gastrointestinal:  Positive for abdominal pain.     PHYSICAL EXAM  ED Triage Vitals [02/14/23 1536]   Temp Pulse Resp BP   36.7 C (98 F) (!) 129 18 105/73      SpO2 Temp src Heart Rate Source Patient Position   100 % -- -- --      BP Location FiO2 (%)     -- --       Physical Exam  Vitals and nursing note reviewed.   Constitutional:       Comments: Appears incredibly uncomfortable, leaning forward and holding her lower abdomen.  Tearful, legs in the fetal position   HENT:      Head: Normocephalic and atraumatic.      Nose: Nose normal.      Mouth/Throat:      Pharynx: Oropharynx is clear.   Eyes:       Extraocular Movements: Extraocular movements intact.      Conjunctiva/sclera: Conjunctivae normal.      Pupils: Pupils are equal, round, and reactive to light.   Cardiovascular:      Rate and Rhythm: Regular rhythm. Tachycardia present.      Pulses: Normal pulses.      Heart sounds: Normal heart sounds.   Pulmonary:      Effort: Pulmonary effort is normal. No respiratory distress.      Breath sounds: Normal breath sounds. No wheezing, rhonchi or rales.   Abdominal:      Comments: Abdomen is soft.  With any palpation there is involuntary guarding.  Patient is exquisitely tender to the right lower quadrant with involuntary guarding.  No rigidity or rebound tenderness   Musculoskeletal:         General: Normal range of motion.      Cervical back: Normal range of motion and neck supple. No tenderness.  Skin:     General: Skin is warm and dry.      Capillary Refill: Capillary refill takes less than 2 seconds.   Neurological:      General: No focal deficit present.      Mental Status: She is alert and oriented to person, place, and time.   Psychiatric:         Mood and Affect: Mood normal.       TESTING RESULTS  PROCEDURES  Labs Reviewed   COMPREHENSIVE METABOLIC PANEL - Abnormal       Result Value    Sodium 134 (*)     Potassium 4.1      Chloride 100      CO2 (Bicarbonate) 25      Anion Gap 9      BUN 7      Creatinine 0.69      eGFRcr 125      Glucose 100      Fasting? Unknown      Calcium 9.0      AST 21      ALT 23      Alkaline phosphatase 56      Protein, total 7.0      Albumin 3.3      Bilirubin, total 0.2     CBC WITH DIFFERENTIAL - Abnormal    WBC 13.3 (*)     RBC 4.55      Hemoglobin 13.1      Hematocrit 39.3      MCV 86.4      MCH 28.8      MCHC 33.3      RDW-CV 13.1      RDW-SD 41.1      Platelets 234      MPV 9.9      Neutrophil % 75.9      Lymphocyte % 13.7      Monocytes % 9.4      Eosinophils % 0.4      Basophils % 0.3      Immature Granulocytes % 0.3      NRBC % 0.0      Neutrophils Absolute 10.12 (*)      Lymphocytes Absolute 1.82      Monocytes Absolute 1.25 (*)     Eosinophils Absolute 0.05      Basophils Absolute 0.04      Immature Granulocytes Absolute 0.04      NRBC Absolute 0.00     HCG, QUANTITATIVE, PREGNANCY - Normal    hCG, serum, quantitative <1      Narrative:     Expected Results:  Adult Female: 0-1 mIU/mL  Non-pregnant Female: 0-4 mIU/mL    If pregnancy is expected, suggest repeat in 24-72 hours.    The concentration of HCG rises rapidly during early pregnancy to a level of 5,000 to 200,000 mIU/mL at 10-12 weeks, followed by a slow decline to levels of 1,000 to 50,000 mIU/mL during the 3rd trimester.    The use of this assay to monitor or diagnose patients with cancer or any other condiion unrelated to pregnancy has not been cleared or approved by the FDA or the manufacturer of the assay.   LIPASE - Normal    Lipase 24     BLOOD CULTURE   BLOOD CULTURE   CBC W/DIFF    Narrative:     The following orders were created for panel order CBC and differential.  Procedure  Abnormality         Status                     ---------                               -----------         ------                     CBC w/ Differential[[id number]]          Abnormal            Final result                 Please view results for these tests on the individual orders.   URINE TESTING (UA, UARC, URINE CULTURE)    Narrative:     The following orders were created for panel order Urine Testing (UA, UARC, Urine Culture).  Procedure                               Abnormality         Status                     ---------                               -----------         ------                     Urinalysis reflex to cul.Marland KitchenMarland Kitchen[[id number]]                                                   Please view results for these tests on the individual orders.   URINALYSIS REFLEX TO CULTURE   LACTIC ACID     CT ABDOMEN PELVIS W CONTRAST   Final Result   Significant wall thickening of the cecum, proximal ascending colon,  and terminal ileum. There is a tubular structure measuring 6 to7 mm in the right lower quadrant near the cecum with intraluminal air, may represent a borderline dilated appendix. Constellation of these findings may represent acute inflammation involving the cecum, proximal right colon, and terminal ileum.      Small amount of free fluid in the pelvis.      Findings were discussed on 02/14/2023 5:13 PM with Cherlynn Kaiser, PA.          Gaspar Cola MD 02/14/2023 5:19 PM        Procedures  ED COURSE/MDM  ED Course as of 02/14/23 1751   Fri Feb 14, 2023   1726 CT ABDOMEN PELVIS W CONTRAST  Consult placed to general surgeon Dr.Yehia regarding findings on CT.  Overall high suspicion for acute appendicitis.  Awaiting response/recommendations   1730 Dr. Altamese Carolina does not feel that CT scans are concerning for appendicitis and rather feels they are more consistent with acute colitis.  Recommends admission to medicine with IV fluids, antibiotics and he will consult on the case   1748 Spoke with hospitalist Dr. Lezlie Lye who will not accept case until patient seen by surgical team.  Reach  back out to surgical team; surgical resident will come to evaluate patient at bedside.           Diagnoses as of 02/14/23 1751   Right lower quadrant abdominal pain   Leukocytosis, unspecified type   Abnormal CT of the abdomen - Significant wall thickening of the cecum, proximal ascending colon, and terminal ileum. There is a tubular structure measuring 6 to7 mm in the right lower quadrant near the cecum with intraluminal air, may represent a borderline dilated appendix. Constellation of these findings may represent acute inflammation involving the cecum, proximal right colon, and terminal ileum.     ED Course & MDM   Medical Decision Making  23 year old female presents to the emergency department today with severe right lower quadrant abdominal pain, fevers, chills, nausea since yesterday.  On my evaluation patient is awake and alert.  She is  incredibly uncomfortable in appearance overall nontoxic.  Vital signs are appreciable for heart rate 129.  Otherwise vital signs are stable and patient is neurovascularly intact.    IV access established in triage and the above workup was obtained.  CBC appreciable for leukocytosis 13.3 with a left shift, otherwise unrevealing.  CMP unremarkable.  hCG negative.  Lipase normal.  CT abdomen pelvis is abnormal with the following findings: Significant wall thickening of the cecum, proximal ascending colon, and terminal ileum. There is a tubular structure measuring 6 to7 mm in the right lower quadrant near the cecum with intraluminal air, may represent a borderline dilated appendix. Constellation of these findings may represent acute inflammation involving the cecum, proximal right colon, and terminal ileum. Small amount of free fluid in the pelvis.    I spoke with general surgeon, Dr. Altamese Carolina; see ED course.  Ultimately, feels that presentation is most concerning for colitis, and he is not concerned for appendicitis.  Clinically, however, I do still have a very high suspicion for appendicitis.  Regardless, spoke with hospitalist who will not accept patient onto the service until seen by surgical team which I agree with.  At completion my shift we are awaiting evaluation by surgical resident.  Case will be signed out to PA Majewski to follow.  In the meantime patient has been ordered for Rocephin, Flagyl, and Dilaudid for pain control.  She is aware of findings and plan at this time.  All questions answered.      Amount and/or Complexity of Data Reviewed  Labs: ordered.  Radiology: ordered. Decision-making details documented in ED Course.    Risk  Prescription drug management.      DIAGNOSIS  Problem List Items Addressed This Visit    None  Visit Diagnoses       Right lower quadrant abdominal pain    -  Primary    Leukocytosis, unspecified type        Relevant Medications    ketorolac (Toradol) injection 15 mg (Completed)     Abnormal CT of the abdomen        Significant wall thickening of the cecum, proximal ascending colon, and terminal ileum. There is a tubular structure measuring 6 to7 mm in the right lower quadrant near the cecum with intraluminal air, may represent a borderline dilated appendix. Constellation of these findings may represent acute inflammation involving the cecum, proximal right colon, and terminal ileum.          CONDITION  Fair    DISPOSITION  Signed out at [address] [place]  02/14/23 1751

## 2023-02-15 ENCOUNTER — Observation Stay: Admit: 2023-02-15 | Payer: PRIVATE HEALTH INSURANCE | Primary: Internal Medicine

## 2023-02-15 DIAGNOSIS — K529 Noninfective gastroenteritis and colitis, unspecified: Secondary | ICD-10-CM

## 2023-02-15 LAB — COMPREHENSIVE METABOLIC PANEL
ALT: 15 U/L (ref 0–55)
AST: 17 U/L (ref 6–42)
Albumin: 2.5 g/dL — ABNORMAL LOW (ref 3.2–5.0)
Alkaline phosphatase: 46 U/L (ref 30–130)
Anion Gap: 6 mmol/L (ref 3–14)
BUN: 7 mg/dL (ref 6–24)
Bilirubin, total: 0.3 mg/dL (ref 0.2–1.2)
CO2 (Bicarbonate): 23 mmol/L (ref 20–32)
Calcium: 8.5 mg/dL (ref 8.5–10.5)
Chloride: 106 mmol/L (ref 98–110)
Creatinine: 0.77 mg/dL (ref 0.55–1.30)
Glucose: 90 mg/dL (ref 70–139)
Potassium: 4 mmol/L (ref 3.6–5.2)
Protein, total: 6.3 g/dL (ref 6.0–8.4)
Sodium: 135 mmol/L (ref 135–146)
eGFRcr: 111 mL/min/{1.73_m2} (ref >=60)

## 2023-02-15 LAB — URINALYSIS REFLEX TO CULTURE
Bacteria, Ur: NONE SEEN
Bilirubin, Ur: NEGATIVE
Blood, Ur: NEGATIVE
Casts, Ur: 0 /LPF (ref 0–4)
Epithelial Cells, UR: 3 {cells}/[HPF] (ref 0–5)
Glucose,Ur: NEGATIVE mg/dL
Ketones, Ur: 40 mg/dL — AB
Leukocyte Esterase, Ur: NEGATIVE WBC/uL
Nitrite, Ur: NEGATIVE
Protein,Ur: 30 mg/dL — AB
RBC, Ur: 2 {cells}/[HPF] (ref 0–4)
Specific Gravity, Ur: >=1.03 (ref 1.005–1.030)
Urobilinogen, Ur: 0.2 U/dL (ref 0.2–1.0)
WBC, Ur: 4 {cells}/[HPF] (ref 0–5)
pH, Ur: 7 (ref 5.0–8.0)

## 2023-02-15 LAB — CBC WITH DIFFERENTIAL
Basophils %: 0.3 %
Basophils Absolute: 0.03 10*3/uL (ref 0.00–0.22)
Eosinophils %: 0.7 %
Eosinophils Absolute: 0.08 10*3/uL (ref 0.00–0.50)
Hematocrit: 34.5 % (ref 32.0–47.0)
Hemoglobin: 11.4 g/dL (ref 11.0–16.0)
Immature Granulocytes %: 0.3 %
Immature Granulocytes Absolute: 0.04 10*3/uL (ref 0.00–0.10)
Lymphocyte %: 20.2 %
Lymphocytes Absolute: 2.37 10*3/uL (ref 0.70–4.00)
MCH: 29.2 pg (ref 26.0–34.0)
MCHC: 33 g/dL (ref 31.0–37.0)
MCV: 88.2 fL (ref 80.0–100.0)
MPV: 9.9 fL (ref 9.1–12.4)
Monocytes %: 11.3 %
Monocytes Absolute: 1.33 10*3/uL — ABNORMAL HIGH (ref 0.36–0.77)
NRBC %: 0 % (ref 0.0–0.0)
NRBC Absolute: 0 10*3/uL (ref 0.00–2.00)
Neutrophil %: 67.2 %
Neutrophils Absolute: 7.9 10*3/uL (ref 1.50–7.95)
Platelets: 203 10*3/uL (ref 150–400)
RBC: 3.91 M/uL (ref 3.70–5.20)
RDW-CV: 13.1 % (ref 11.5–14.5)
RDW-SD: 42.3 fL (ref 35.0–51.0)
WBC: 11.8 10*3/uL — ABNORMAL HIGH (ref 4.0–11.0)

## 2023-02-15 LAB — C-REACTIVE PROTEIN: CRP: 14.1 mg/dL — ABNORMAL HIGH (ref 0.29–0.80)

## 2023-02-15 LAB — SEDIMENTATION RATE, AUTOMATED: Sed Rate: 30 mm/h — ABNORMAL HIGH (ref 0–20)

## 2023-02-15 LAB — LACTIC ACID
Lactic acid: 0.8 mmol/L (ref 0.4–2.0)
Lactic acid: 0.9 mmol/L (ref 0.4–2.0)

## 2023-02-15 MED ORDER — metroNIDAZOLE in NaCl (iso-os) (Flagyl) IVPB 500 mg
500 | Freq: Three times a day (TID) | INTRAVENOUS | Status: DC
Start: 2023-02-15 — End: 2023-02-16
  Administered 2023-02-15 – 2023-02-16 (×5): 500 mg via INTRAVENOUS

## 2023-02-15 MED ORDER — acetaminophen (Tylenol) tablet 650 mg
325 | Freq: Four times a day (QID) | ORAL | Status: DC | PRN
Start: 2023-02-15 — End: 2023-02-16
  Administered 2023-02-15 – 2023-02-16 (×4): 650 mg via ORAL

## 2023-02-15 MED ORDER — sodium chloride 0.9 % infusion
0.9 | INTRAVENOUS | Status: AC
Start: 2023-02-15 — End: 2023-02-15
  Administered 2023-02-15: 07:00:00 75 mL/h via INTRAVENOUS

## 2023-02-15 MED ORDER — iohexol (OMNIPaque) 350 mg iodine/mL solution 100 mL
350 | Freq: Once | INTRAVENOUS | Status: AC
Start: 2023-02-15 — End: 2023-02-15
  Administered 2023-02-15: 18:00:00 100 mL via INTRAVENOUS

## 2023-02-15 MED ORDER — drospirenone-ethinyl estradioL (Yaz, Gianvi) 3-0.02 mg per tablet 1 tablet
3-0.02 | Freq: Every evening | ORAL | Status: DC
Start: 2023-02-15 — End: 2023-02-16
  Administered 2023-02-16: 01:00:00 1 via ORAL

## 2023-02-15 MED ORDER — Non-Formulary Medication
Freq: Every day | Status: DC
Start: 2023-02-15 — End: 2023-02-15

## 2023-02-15 MED ORDER — cefTRIAXone (Rocephin) 1 g in sodium chloride 0.9 % 100 mL IVPB-MBP
1 | INTRAMUSCULAR | Status: DC
Start: 2023-02-15 — End: 2023-02-16
  Administered 2023-02-15: 21:00:00 1 g via INTRAVENOUS

## 2023-02-15 MED ORDER — drospirenone-ethinyl estradioL (Yaz, Gianvi) 3-0.02 mg per tablet 1 tablet
3-0.02 | Freq: Every day | ORAL | Status: DC
Start: 2023-02-15 — End: 2023-02-15

## 2023-02-15 MED ORDER — ondansetron (Zofran) injection 4 mg
4 | Freq: Three times a day (TID) | INTRAMUSCULAR | Status: DC | PRN
Start: 2023-02-15 — End: 2023-02-16
  Administered 2023-02-15: 10:00:00 4 mg via INTRAVENOUS

## 2023-02-15 MED ORDER — iohexol (OMNIPaque) 240 mg iodine/mL solution 50 mL
240 | Freq: Once | INTRAVENOUS | Status: AC
Start: 2023-02-15 — End: 2023-02-15
  Administered 2023-02-15: 16:00:00 50 mL via ORAL

## 2023-02-15 MED ORDER — HYDROmorphone (Dilaudid) injection 0.5 mg
0.5 | INTRAMUSCULAR | Status: DC | PRN
Start: 2023-02-15 — End: 2023-02-16
  Administered 2023-02-15 – 2023-02-16 (×10): 0.5 mg via INTRAVENOUS

## 2023-02-15 MED ORDER — heparin (porcine) injection 5,000 Units
5000 | Freq: Three times a day (TID) | INTRAMUSCULAR | Status: DC
Start: 2023-02-15 — End: 2023-02-16
  Administered 2023-02-15 – 2023-02-16 (×4): 5000 [IU] via SUBCUTANEOUS

## 2023-02-15 MED FILL — HYDROMORPHONE 0.5 MG/0.5 ML INJECTION WRAPPER: 0.5 0.5 mg/0.5 mL | INTRAMUSCULAR | Qty: 0.5

## 2023-02-15 MED FILL — IOHEXOL 240 MG IODINE/ML INTRAVENOUS SOLUTION: 240 240 mg iodine/mL | INTRAVENOUS | Qty: 50

## 2023-02-15 MED FILL — METRONIDAZOLE 500 MG/100 ML IN SODIUM CHLOR(ISO) INTRAVENOUS PIGGYBACK: 500 500 mg/100 mL | INTRAVENOUS | Qty: 100

## 2023-02-15 MED FILL — CEFTRIAXONE IVPB 1 G MINI-BAG PLUS IN 100 ML NS: 1.0000 1.0000 g | Qty: 1

## 2023-02-15 MED FILL — ACETAMINOPHEN 325 MG TABLET: 325 325 mg | ORAL | Qty: 2

## 2023-02-15 MED FILL — KETOROLAC 15 MG/ML INJECTION SOLUTION: 15 15 mg/mL | INTRAMUSCULAR | Qty: 1

## 2023-02-15 MED FILL — ONDANSETRON HCL (PF) 4 MG/2 ML INJECTION SOLUTION: 4 4 mg/2 mL | INTRAMUSCULAR | Qty: 2

## 2023-02-15 MED FILL — HEPARIN (PORCINE) 5,000 UNIT/ML INJECTION SOLUTION: 5000 5,000 unit/mL | INTRAMUSCULAR | Qty: 1

## 2023-02-15 NOTE — Progress Notes (Signed)
PROGRESS NOTE    Today's Date: 02/15/2023  MRN: [id number]  Name: Holy Battenfield  DOB: [birth year]  LOS: 0 Days   Subjective   Overnight events: fever   Complaints: Having abdominal pain, nausea, surgery following plan to repeat CT abdomen and pelvis today.  Low-grade fever.  ROS: Negative except as stated above.      Objective   Exam:  BP 102/60 (BP Location: Right arm, Patient Position: Semi-Fowler)   Pulse (!) 130   Temp 37.2 C (98.9 F) (Oral)   Resp 20   Ht 1.778 m   Wt 68 kg   SpO2 98%   BMI 21.51 kg/m   Oxygen Therapy  SpO2: 98 %  Patient Activity During SpO2 Measurement: At rest  Oxygen Therapy: None (Room air)     Intake/Output Summary (Last 24 hours) at 02/15/2023 1253  Last data filed at 02/15/2023 0331  Gross per 24 hour   Intake 1100 ml   Output --   Net 1100 ml      Physical Exam  Constitutional:       Appearance: Normal appearance.   HENT:      Head: Atraumatic.   Eyes:      Pupils: Pupils are equal, round, and reactive to light.   Cardiovascular:      Rate and Rhythm: Normal rate and regular rhythm.   Pulmonary:      Breath sounds: Normal breath sounds.   Abdominal:      General: Bowel sounds are normal. There is no distension.      Palpations: Abdomen is soft.      Tenderness: There is abdominal tenderness. There is no guarding or rebound.   Musculoskeletal:      Cervical back: Neck supple.   Neurological:      General: No focal deficit present.      Mental Status: She is alert and oriented to person, place, and time.      Cranial Nerves: No cranial nerve deficit.      Motor: No weakness.      Gait: Gait normal.          Current medications:    Current Facility-Administered Medications:     acetaminophen (Tylenol) tablet 650 mg, 650 mg, oral, q6h PRN, Girtha Hake, MD, 650 mg at 02/15/23 0501    cefTRIAXone (Rocephin) 1 g in sodium chloride 0.9 % 100 mL IVPB-MBP, 1 g, intravenous, q24h, Girtha Hake, MD    drospirenone-ethinyl estradioL (Yaz, Gianvi) 3-0.02 mg per tablet 1 tablet, 1  tablet, oral, Nightly, Loris Seelye, MD    heparin (porcine) injection 5,000 Units, 5,000 Units, subcutaneous, q8h SCH, Girtha Hake, MD    HYDROmorphone (Dilaudid) injection 0.5 mg, 0.5 mg, intravenous, q3h PRN, Girtha Hake, MD, 0.5 mg at 02/15/23 1103    metroNIDAZOLE in NaCl (iso-os) (Flagyl) IVPB 500 mg, 500 mg, intravenous, q8h, Girtha Hake, MD, Last Rate: 100 mL/hr at 02/15/23 1105, 500 mg at 02/15/23 1105    ondansetron (Zofran) injection 4 mg, 4 mg, intravenous, q8h PRN, Girtha Hake, MD, 4 mg at 02/15/23 0501    sodium chloride 0.9 % infusion, 75 mL/hr, intravenous, Continuous, Girtha Hake, MD, Last Rate: 75 mL/hr at 02/15/23 0131, 75 mL/hr at 02/15/23 0131     Recent Results (from the past 24 hour(s))   Comprehensive metabolic panel    Collection Time: 02/14/23  3:55 PM   Result Value Ref Range    Sodium 134 (L) 135 - 146 mmol/L    Potassium 4.1  3.6 - 5.2 mmol/L    Chloride 100 98 - 110 mmol/L    CO2 (Bicarbonate) 25 20 - 32 mmol/L    Anion Gap 9 3 - 14 mmol/L    BUN 7 6 - 24 mg/dL    Creatinine 5.78 4.69 - 1.30 mg/dL    eGFRcr 629 >=52 WU/XLK/4.40N*0    Glucose 100 70 - 139 mg/dL    Fasting? Unknown     Calcium 9.0 8.5 - 10.5 mg/dL    AST 21 6 - 42 U/L    ALT 23 0 - 55 U/L    Alkaline phosphatase 56 30 - 130 U/L    Protein, total 7.0 6.0 - 8.4 g/dL    Albumin 3.3 3.2 - 5.0 g/dL    Bilirubin, total 0.2 0.2 - 1.2 mg/dL   hCG, Quantitative    Collection Time: 02/14/23  3:55 PM   Result Value Ref Range    hCG, serum, quantitative <1 <=5 mIU/mL   Lipase    Collection Time: 02/14/23  3:55 PM   Result Value Ref Range    Lipase 24 13 - 75 U/L   CBC w/ Differential    Collection Time: 02/14/23  3:55 PM   Result Value Ref Range    WBC 13.3 (H) 4.0 - 11.0 K/uL    RBC 4.55 3.70 - 5.20 M/uL    Hemoglobin 13.1 11.0 - 16.0 g/dL    Hematocrit 27.2 53.6 - 47.0 %    MCV 86.4 80.0 - 100.0 fL    MCH 28.8 26.0 - 34.0 pg    MCHC 33.3 31.0 - 37.0 g/dL    RDW-CV 64.4 03.4 - 74.2 %    RDW-SD 41.1 35.0 -  51.0 fL    Platelets 234 150 - 400 K/uL    MPV 9.9 9.1 - 12.4 fL    Neutrophil % 75.9 %    Lymphocyte % 13.7 %    Monocytes % 9.4 %    Eosinophils % 0.4 %    Basophils % 0.3 %    Immature Granulocytes % 0.3 %    NRBC % 0.0 0.0 - 0.0 %    Neutrophils Absolute 10.12 (H) 1.50 - 7.95 K/uL    Lymphocytes Absolute 1.82 0.70 - 4.00 K/uL    Monocytes Absolute 1.25 (H) 0.36 - 0.77 K/uL    Eosinophils Absolute 0.05 0.00 - 0.50 K/uL    Basophils Absolute 0.04 0.00 - 0.22 K/uL    Immature Granulocytes Absolute 0.04 0.00 - 0.10 K/uL    NRBC Absolute 0.00 0.00 - 2.00 K/uL   Lactic acid    Collection Time: 02/14/23  6:00 PM   Result Value Ref Range    Lactic acid 0.9 0.4 - 2.0 mmol/L   Urinalysis reflex to culture    Collection Time: 02/14/23 11:04 PM   Result Value Ref Range    Color, Ur Yellow Yellow, Dark Yellow    Clarity, Ur Clear Clear    Specific Gravity, Ur >=1.030 1.005 - 1.030    pH, Ur 7.0 5.0 - 8.0    Protein,Ur 30 (A) Negative mg/dL    Glucose,Ur Negative Negative mg/dL    Ketones, Ur 40 (A) Negative mg/dL    Bilirubin, Ur Negative Negative    Blood, Ur Negative Negative    Urobilinogen, Ur 0.2 0.2-1.0 E.U./dl E.U./dl    Nitrite, Ur Negative Negative    Leukocyte Esterase, Ur Negative Negative WBC/uL    RBC, Ur 2 0-4 cells/HPF cells/HPF  WBC, Ur 4 0-5 cells/HPF cells/HPF    Epithelial Cells, UR 3 0-5 cells/HPF cells/HPF    Bacteria, Ur None Seen None Seen    Casts, Ur 0 0-4 cells/LPF cells/LPF   Lactic acid, plasma    Collection Time: 02/15/23  4:34 AM   Result Value Ref Range    Lactic acid 0.8 0.4 - 2.0 mmol/L   Comprehensive metabolic panel    Collection Time: 02/15/23  4:34 AM   Result Value Ref Range    Sodium 135 135 - 146 mmol/L    Potassium 4.0 3.6 - 5.2 mmol/L    Chloride 106 98 - 110 mmol/L    CO2 (Bicarbonate) 23 20 - 32 mmol/L    Anion Gap 6 3 - 14 mmol/L    BUN 7 6 - 24 mg/dL    Creatinine 1.61 0.96 - 1.30 mg/dL    eGFRcr 045 >=40 JW/JXB/1.47W*2    Glucose 90 70 - 139 mg/dL    Fasting? Unknown      Calcium 8.5 8.5 - 10.5 mg/dL    AST 17 6 - 42 U/L    ALT 15 0 - 55 U/L    Alkaline phosphatase 46 30 - 130 U/L    Protein, total 6.3 6.0 - 8.4 g/dL    Albumin 2.5 (L) 3.2 - 5.0 g/dL    Bilirubin, total 0.3 0.2 - 1.2 mg/dL   CBC w/ Differential    Collection Time: 02/15/23  4:34 AM   Result Value Ref Range    WBC 11.8 (H) 4.0 - 11.0 K/uL    RBC 3.91 3.70 - 5.20 M/uL    Hemoglobin 11.4 11.0 - 16.0 g/dL    Hematocrit 95.6 21.3 - 47.0 %    MCV 88.2 80.0 - 100.0 fL    MCH 29.2 26.0 - 34.0 pg    MCHC 33.0 31.0 - 37.0 g/dL    RDW-CV 08.6 57.8 - 46.9 %    RDW-SD 42.3 35.0 - 51.0 fL    Platelets 203 150 - 400 K/uL    MPV 9.9 9.1 - 12.4 fL    Neutrophil % 67.2 %    Lymphocyte % 20.2 %    Monocytes % 11.3 %    Eosinophils % 0.7 %    Basophils % 0.3 %    Immature Granulocytes % 0.3 %    NRBC % 0.0 0.0 - 0.0 %    Neutrophils Absolute 7.90 1.50 - 7.95 K/uL    Lymphocytes Absolute 2.37 0.70 - 4.00 K/uL    Monocytes Absolute 1.33 (H) 0.36 - 0.77 K/uL    Eosinophils Absolute 0.08 0.00 - 0.50 K/uL    Basophils Absolute 0.03 0.00 - 0.22 K/uL    Immature Granulocytes Absolute 0.04 0.00 - 0.10 K/uL    NRBC Absolute 0.00 0.00 - 2.00 K/uL   C-reactive protein    Collection Time: 02/15/23  4:34 AM   Result Value Ref Range    CRP 14.10 (H) 0.29 - 0.80 mg/dL   Sedimentation rate, automated    Collection Time: 02/15/23  4:34 AM   Result Value Ref Range    Sed Rate 30 (H) 0 - 20 mm/hr     CT ABDOMEN PELVIS W CONTRAST   Final Result   Significant wall thickening of the cecum, proximal ascending colon, and terminal ileum. There is a tubular structure measuring 6 to7 mm in the right lower quadrant near the cecum with intraluminal air, may represent a borderline dilated appendix. Constellation of these  findings may represent acute inflammation involving the cecum, proximal right colon, and terminal ileum.      Small amount of free fluid in the pelvis.      Findings were discussed on 02/14/2023 5:13 PM with Cherlynn Kaiser, PA.          Gaspar Cola  MD 02/14/2023 5:19 PM      CT ABDOMEN PELVIS W CONTRAST    (Results Pending)         Assessment    Principal Problem:    Colitis      23 year old female without any prior medical history complains of abdominal pain, fever, nausea, once episode of bloody BM  Admitted with ileocolitis           # Ileocolitis concern for IBD ddx infectious   #? Appendicitis   Labs with leucocytoses, fever, ESR 30  CT a/p: Significant wall thickening of the cecum, proximal ascending colon, and terminal ileum, borderline dilated appendix. Constellation of these findings may represent acute inflammation involving the cecum, proximal right colon, and terminal ileum.     Plan: IV hydration, continue ceftriaxone and Flagyl, follow stool cx  Surgery following plan to repeat CT abdomen and pelvis with contrast today, no plan for surgical intervention given low suspicious of appendicitis  I discussed with GI high suspicious for IBD, will continue antibiotics for now, will check calprotectin follow outpatient for colonoscopy  Continue pain management  Monitor CBC/BMP      DVT prophylaxis: Heparin  Code Status and Discussions: Full Code   Social Determinants of Health that impact treatment or disposition: None  Shared decision making: Patient   Family Update:   Ongoing Medical Necessity for hospital stay: IV abx /CT a/p   Discharge plan/Disposition: Not medically ready. TBD.       Will Bonnet, MD

## 2023-02-15 NOTE — Progress Notes (Signed)
 GENERAL SURGERY PROGRESS NOTE     SUBJECTIVE:  Patient continues to endorse RLQ pain, unchanged from day before.  Endorsing nausea overnight due to pain.    PROCEDURE(S):    from No surgery found    DIAGNOSIS:  Ileocolitis    PHYSICAL EXAM:   Temp:  [36.7 C (98 F)-38.1 C (100.6 F)] 37.2 C (98.9 F)  Pulse:  [89-134] 130  Resp:  [12-29] 20  SpO2:  [98 %-100 %] 98 %  BP: (92-114)/(59-77) 102/60    GENERAL:  Uncomfortable appearing  HEENT: NCAT, MMM  CARDIAC: NSR on monitor and RRR, radial pulses palpable and symmetric  PULM: No increased work of breathing, no accessory muscle use and comfortable on room air  GI: Soft, nondistended, severely TTP in RLQ but no rebound tenderness or peritoneal signs  GU: Deferred  EXT: Moving all extremities and Warm and well perfused  NEURO: No focal deficits    Objective   LABS:  11.8 \ 11.4 / 203    / 34.5 \     135 106 7 / 90   4.0 23 0.77 \     -8.5   -No results found for requested labs within last 365 days.   -No results found for requested labs within last 365 days.          KEY IMAGING:  No new imaging results       ASSESSMENT AND PLAN:   23 y.o.female with no significant pmh on whom general surgery has been consulted for c/f colitis.     Pt is tachycardic, likely secondary to pain.  White count downtrending now at 11.8 today from 13.3.  No peritoneal signs on physical exam, though continues to have severe tenderness to palpation in RLQ.  Borderline dilated appendix most likely secondary to colitis, rather than acute appendicitis.  Would recommend repeating CTAP today with p.o. and IV contrast to guide need for surgical intervention.  Would also recommend GI consult to further workup etiology of patient's colitis.  Surgery will continue to follow.    Recommendations  -Follow-up repeat CTAP p.o./IV contrast  -GI consult  -Rest of care per primary team    Patient discussed with Dr. Jonah Blue, MD  General Surgery Resident

## 2023-02-15 NOTE — Nursing Note (Signed)
Shift note  Patient admitted with colitis/ on Flagyl iv and Rocephin iv.  NPO.    Tylenol and Dilaudid given for abd pain.  NSR on tele.

## 2023-02-15 NOTE — Other (Signed)
 Patient Education  Table of Contents   Inflammation of the Colon (Colitis): What to Know    To view videos and all your education online visit,  https://pe.elsevier.com/[email or web address]  or scan this QR code with your smartphone.  Access to this content will expire in one year.  Inflammation of the Colon (Colitis): What to Know    Colitis is inflammation of the colon. It can cause watery poop (diarrhea), blood in the stool, and pain in the belly. Colitis can last a short time (acute), or it may last a long time (chronic).  What are the causes?  Colitis may be caused by:   Infections from germs. These can be viruses or bacteria.   A reaction to medicine.   Certain autoimmune diseases, such as Crohn's disease or ulcerative colitis.   Radiation treatment.   Decreased blood flow to the bowel (ischemia).  What are the signs or symptoms?  Symptoms of this condition include:   Watery poop, blood in the poop, or black, tarry poop.   Pain in the joints or pain in the belly.   Fever or fatigue.   Throwing up, weight loss, or bloating.   Pooping less than usual.   A strong and sudden urge to poop.   Feeling like the bowel is not empty after pooping.  How is this diagnosed?  Colitis may be diagnosed based on a poop (stool) test and a blood test. You may also have other tests, such as:   X-rays.   CT scan.   Colonoscopy.   Endoscopy.   Biopsy.  How is this treated?  Treatment for colitis depends on the cause. Colitis may be treated with:   Resting the bowel. You may be told not to eat or drink for a period of time.   Fluids that are given through an IV.   Medicine for pain and diarrhea.   Medicines, such as antibiotics and cortisone.   Surgery.  Follow these instructions at home:  Eating and drinking   Eat and drink as told.  ? Drink more fluids as told.  ? Eat a well-balanced diet.  ? Avoid foods or drinks that cause colitis or make it worse.   Work with a dietitian to know if some foods cause your colitis, or make it worse.  General  instructions   Take your medicines only as told.  ? If you were given antibiotics, take them as told. Do not stop taking them even if you start to feel better.   Keep all follow-up visits. Your provider needs to monitor your treatment.  Contact a health care provider if:   Your symptoms do not go away.   You get new symptoms.  Get help right away if:   You have a fever that does not go away with treatment.   You get chills.   You faint or you become very weak.   You lose too much fluid in your body. Symptoms include:  ? Dark pee, very little pee, or no pee.   ? Cracked lips or dry mouth.   ? Sleepiness or weakness.   You throw up again and again.   You have very bad pain in your belly.   You have blood in your poop, or you have tarry poop.  This information is not intended to replace advice given to you by your health care provider. Make sure you discuss any questions you have with your health care provider.  Document Released: 2004-02-15 Document Updated: 2022-09-03  Document Reviewed: 2022-09-03  Elsevier Patient Education ? 2024 Elsevier Inc.

## 2023-02-15 NOTE — Care Plan (Addendum)
Goal Outcome Evaluation:  Plan of Care Reviewed With: Ellen Herrera  Progress: improving     Pt admitted to the unit from ED for evaluation of colitis. Pt A&OX4, V/S noted. On RA. C/o abdomen pain 10/10 given dilaudid with some effect. Abd is soft tenderness no sign of rebound or rigid. Pt is febrile given tylenol with good effect. Pt c/o whole body ache. Iv abx infuse per MAR. Ns infusing at 38ml/hr. OOB to bathroom with standby assist. Safety maintained, call light within reach.        Problem: Adult Inpatient Plan of Care  Goal: Plan of Care Review  Outcome: Ongoing, Progressing  Flowsheets (Taken 02/15/2023 0111)  Progress: improving  Plan of Care Reviewed With: Ellen Herrera  Goal: Ellen Herrera-Specific Goal (Individualized)  Outcome: Ongoing, Progressing  Goal: Absence of Hospital-Acquired Illness or Injury  Outcome: Ongoing, Progressing  Intervention: Identify and Manage Fall Risk  Recent Flowsheet Documentation  Taken 02/15/2023 0100 by Howard Pouch, RN  Safety Promotion/Fall Prevention:   clutter-free environment maintained   assistive device/personal items within reach   activity supervised   fall prevention program maintained   lighting adjusted  Intervention: Prevent Skin Injury  Recent Flowsheet Documentation  Taken 02/15/2023 0100 by Howard Pouch, RN  Body Position: position changed independently  Skin Protection:   adhesive use limited   tubing/devices free from skin contact  Intervention: Prevent and Manage VTE (Venous Thromboembolism) Risk  Recent Flowsheet Documentation  Taken 02/15/2023 0100 by Howard Pouch, RN  Range of Motion: active ROM (range of motion) encouraged  Activity Expectations: activity adjusted per tolerance  VTE Prevention/Management: Ellen Herrera refused intervention  Intervention: Prevent Infection  Recent Flowsheet Documentation  Taken 02/15/2023 0100 by Howard Pouch, RN  Infection Prevention:   hand hygiene promoted   rest/sleep promoted  Goal: Optimal Comfort and Wellbeing  Outcome: Ongoing,  Progressing  Intervention: Monitor Pain and Promote Comfort  Recent Flowsheet Documentation  Taken 02/15/2023 0100 by Howard Pouch, RN  Pain Management Interventions:   medication administered   care clustered   position adjusted   pillow support provided  Intervention: Provide Person-Centered Care  Recent Flowsheet Documentation  Taken 02/15/2023 0100 by Howard Pouch, RN  Trust Relationship/Rapport: questions encouraged  Goal: Readiness for Transition of Care  Outcome: Ongoing, Progressing     Problem: Pain Acute  Goal: Acceptable Pain Control and Functional Ability  Outcome: Ongoing, Progressing  Intervention: Develop Pain Management Plan  Recent Flowsheet Documentation  Taken 02/15/2023 0100 by Howard Pouch, RN  Pain Management Interventions:   medication administered   care clustered   position adjusted   pillow support provided  Intervention: Optimize Psychosocial Wellbeing  Recent Flowsheet Documentation  Taken 02/15/2023 0100 by Howard Pouch, RN  Diversional Activities: television

## 2023-02-15 NOTE — Consults (Signed)
[facility]  GASTROENTEROLOGY CONSULT     HPI:  Ellen Herrera is a 23 y.o. female who presents for ileocolitis and abdominal pain.    Referring Provider:  No ref. provider found  Primary Provider:  No PCP, Per Patient    HPI:  This patient who is otherwise healthy presents for new onset RLQ pain.  Her CT imaging was concerning for ileocolitis.    She reports a 3-day history of pain that is primarily been in the right lower quadrant.  She has no nausea or vomiting.  No dysphagia or odynophagia.  No change in bowel habit.  In particular, no diarrhea or melena.  She reports some mild red blood tinged stool but no overt rectal bleeding.  She has solid formed stool, approximately 1 BM/day.  She does have subjective fevers over the last 2 days.  No weight loss.  She denies any history of extraintestinal manifestations of IBD such as skin rash, arthralgias or conjunctivitis/scleritis.    She has never had a prior EGD or colonoscopy.    Review of her CT from 03/2022 showed fecalization of the small bowel which may indicate chronic luminal narrowing in this area.  Her CT on this admission shows significant thickening in the TI, cecum and proximal ascending colon.  The appendix was dilated there is a small amount of free fluid in the pelvis.    She is a non-smoker.  Usually consumes 3 drinks per week.  She is a high Education officer, museum.  No prior abdominal surgery.  She does not take any blood thinner medications.  There is no family history of IBD.      Past Medical History:   Diagnosis Date    Anxiety        History reviewed. No pertinent surgical history.    Social History     Socioeconomic History    Marital status: Single     Spouse name: Not on file    Number of children: Not on file    Years of education: Not on file    Highest education level: Not on file   Occupational History    Not on file   Tobacco Use    Smoking status: Never    Smokeless tobacco: Never   Substance  and Sexual Activity    Alcohol use: Yes    Drug use: Never     Types: Other    Sexual activity: Yes   Other Topics Concern    Not on file   Social History Narrative    Not on file     Social Determinants of Health     Financial Resource Strain: Patient Declined (02/15/2023)    Overall Financial Resource Strain (CARDIA)     Difficulty of Paying Living Expenses: Patient declined   Food Insecurity: Unknown (02/15/2023)    Hunger Vital Sign     Worried About Running Out of Food in the Last Year: Patient declined     Ran Out of Food in the Last Year: Not on file   Transportation Needs: Patient Declined (02/15/2023)    PRAPARE - Therapist, art (Medical): Patient declined     Lack of Transportation (Non-Medical): Patient declined   Physical Activity: Not on file   Stress: Not on file   Social Connections: Not on file   Intimate Partner Violence: Not on file   Housing Stability: Unknown (02/15/2023)    Housing Stability Vital Sign  Unable to Pay for Housing in the Last Year: Not on file     Number of Times Moved in the Last Year: Not on file     Homeless in the Last Year: Patient declined       Current Facility-Administered Medications   Medication Dose Route Frequency Provider Last Rate Last Admin    acetaminophen (Tylenol) tablet 650 mg  650 mg oral q6h PRN Girtha Hake, MD   650 mg at 02/15/23 1430    cefTRIAXone (Rocephin) 1 g in sodium chloride 0.9 % 100 mL IVPB-MBP  1 g intravenous q24h Girtha Hake, MD        drospirenone-ethinyl estradioL Dianah Field, Gianvi) 3-0.02 mg per tablet 1 tablet  1 tablet oral Nightly Evgjeni Xhoxhi, MD        heparin (porcine) injection 5,000 Units  5,000 Units subcutaneous q8h [facility] Girtha Hake, MD   5,000 Units at 02/15/23 1420    HYDROmorphone (Dilaudid) injection 0.5 mg  0.5 mg intravenous q3h PRN Girtha Hake, MD   0.5 mg at 02/15/23 1432    metroNIDAZOLE in NaCl (iso-os) (Flagyl) IVPB 500 mg  500 mg intravenous q8h Girtha Hake, MD 100 mL/hr at  02/15/23 1105 500 mg at 02/15/23 1105    ondansetron (Zofran) injection 4 mg  4 mg intravenous q8h PRN Girtha Hake, MD   4 mg at 02/15/23 0501       Allergies   Allergen Reactions    Amoxicillin        Review of Systems   See HPI for details.      PHYSICAL EXAMINATION:  Patient Vitals for the past 24 hrs:   BP Temp Temp src Pulse Resp SpO2 Height Weight   02/15/23 0757 102/60 37.2 C (98.9 F) Oral (!) 130 20 98 % -- --   02/15/23 0453 114/76 37.3 C (99.2 F) Oral 118 16 98 % -- --   02/15/23 0140 92/59 -- -- 100 -- -- -- --   02/15/23 0135 92/71 -- -- (!) 134 19 -- -- --   02/15/23 0130 102/72 37.1 C (98.8 F) Oral 98 18 -- -- --   02/14/23 2230 104/63 (!) 38.1 C (100.6 F) Oral 98 18 98 % 1.778 m 68 kg   02/14/23 2200 102/72 -- -- 98 16 100 % -- --   02/14/23 2100 103/69 -- -- 108 13 99 % -- --   02/14/23 2023 109/73 -- -- 100 29 100 % -- --   02/14/23 2000 105/72 -- -- 103 15 100 % -- --   02/14/23 1900 107/77 -- -- 89 12 100 % -- --   02/14/23 1845 103/67 -- -- 97 18 100 % -- --   02/14/23 1815 106/72 -- -- -- -- -- -- --   02/14/23 1538 -- -- -- -- -- -- 1.778 m 68 kg   02/14/23 1536 105/73 36.7 C (98 F) -- (!) 129 18 100 % -- --     Wt Readings from Last 3 Encounters:   02/14/23 68 kg      Body mass index is 21.51 kg/m.    GENERAL APPEARANCE: Well appearing, alert, in no acute distress, well-hydrated, well nourished.  SKIN: Skin color, texture, turgor normal, no suspicious rashes or lesions.   EYES: Anicteric sclera. Pupils are equally round and reactive to light. Extraocular movements are intact.  NECK: Supple, no adenopathy. Thyroid is symmetric, normal size. No bruits.   LUNGS: No accessory muscle use, no cyanosis, no asymmetric calf  swelling, no bony deformity.   HEART: Normal JVP, no signficant peripheral edema, no thrill.  ABDOMEN: Abdomen soft, with tenderness diffusely but primarily in the right lower quadrant.  No rebound, non-distended. No masses, ascites or hepatosplenomegaly.    EXTREMITIES: No deformities, edema, skin discoloration, clubbing or cyanosis.   NEUROLOGIC: Gait not assessed. Sensation and strength grossly intact.     LABS:  Lab Results   Component Value Date    HCT 34.5 02/15/2023    WBC 11.8 (H) 02/15/2023    PLT 203 02/15/2023     Lab Results   Component Value Date    ALKPHOS 46 02/15/2023    AST 17 02/15/2023    ALT 15 02/15/2023       IMAGING:  Imaging including X-rays, Ultrasound, CT scans and MRI scans reviewed if available.    See CT imaging reported above    Plan   ASSESSMENT AND PLAN:  10F, previously healthy, presenting for 3-day history of RLQ pain with imaging concerning for ileocolitis between the TI and proximal ascending colon and question of possible appendicitis.    -Ileocolitis  She has imaging findings of inflammation affecting the TI, cecum and proximal ascending colon.  Her current presentation is more in keeping with Crohn's disease rather than acute appendicitis.  I reviewed her CT from 03/2022 that showed fecalization of the small bowel which may indicate chronic luminal narrowing in this area which would indicate a chronic inflammatory process rather than acute issues with the appendix.    As well, her ESR and CRP are elevated.  Would recommend checking fecal calprotectin.  She does not report any diarrhea but stool infectious workup may be considered but is likely low yield.    She has been seen by general surgery and they are considering repeat CT scan to evaluate for appendicitis.  If this is normal, I would recommend a colonoscopy in the short-term as an outpatient for further evaluation for ileocolic Crohn's.    Thank you for involving me in the care of this patient.     Birdena Crandall, MD MPH [facility]  Gastroenterology, Hepatology and Interventional Endoscopy

## 2023-02-16 LAB — CBC
Hematocrit: 33.9 % (ref 32.0–47.0)
Hemoglobin: 10.9 g/dL — ABNORMAL LOW (ref 11.0–16.0)
MCH: 28.5 pg (ref 26.0–34.0)
MCHC: 32.2 g/dL (ref 31.0–37.0)
MCV: 88.5 fL (ref 80.0–100.0)
MPV: 10 fL (ref 9.1–12.4)
NRBC %: 0 % (ref 0.0–0.0)
NRBC Absolute: 0 10*3/uL (ref 0.00–2.00)
Platelets: 219 10*3/uL (ref 150–400)
RBC: 3.83 M/uL (ref 3.70–5.20)
RDW-CV: 13.1 % (ref 11.5–14.5)
RDW-SD: 42.6 fL (ref 35.0–51.0)
WBC: 7.8 10*3/uL (ref 4.0–11.0)

## 2023-02-16 LAB — COMPREHENSIVE METABOLIC PANEL
ALT: 15 U/L (ref 0–55)
AST: 21 U/L (ref 6–42)
Albumin: 2.4 g/dL — ABNORMAL LOW (ref 3.2–5.0)
Alkaline phosphatase: 46 U/L (ref 30–130)
Anion Gap: 5 mmol/L (ref 3–14)
BUN: 7 mg/dL (ref 6–24)
Bilirubin, total: 0.2 mg/dL (ref 0.2–1.2)
CO2 (Bicarbonate): 25 mmol/L (ref 20–32)
Calcium: 8.1 mg/dL — ABNORMAL LOW (ref 8.5–10.5)
Chloride: 107 mmol/L (ref 98–110)
Creatinine: 0.55 mg/dL (ref 0.55–1.30)
Glucose: 80 mg/dL (ref 70–139)
Potassium: 3.8 mmol/L (ref 3.6–5.2)
Protein, total: 5.9 g/dL — ABNORMAL LOW (ref 6.0–8.4)
Sodium: 137 mmol/L (ref 135–146)
eGFRcr: 132 mL/min/{1.73_m2} (ref >=60)

## 2023-02-16 LAB — STOOL PATHOGENS PANEL BY PCR
Campylobacter: NOT DETECTED
Norovirus: NOT DETECTED
Rotavirus: NOT DETECTED
Salmonella: NOT DETECTED
Shigatoxin 1: NOT DETECTED
Shigatoxin 2: NOT DETECTED
Shigella: NOT DETECTED
Vibrio: NOT DETECTED
Yersinia enterocolitica: NOT DETECTED

## 2023-02-16 LAB — CLOSTRIDIOIDES DIFFICILE BY PCR: C diff PCR: NOT DETECTED

## 2023-02-16 MED ORDER — oxyCODONE (Roxicodone) immediate release tablet 5 mg
5 | ORAL | Status: DC | PRN
Start: 2023-02-16 — End: 2023-02-16
  Administered 2023-02-16 (×2): 5 mg via ORAL

## 2023-02-16 MED ORDER — cefTRIAXone (Rocephin) 1 g in sodium chloride 0.9 % 100 mL IVPB-MBP
INTRAVENOUS | Status: DC
Start: 2023-02-16 — End: 2023-02-16
  Administered 2023-02-16: 20:00:00 1 g via INTRAVENOUS

## 2023-02-16 MED ORDER — oxyCODONE (Roxicodone) 5 mg immediate release tablet
5 | ORAL_TABLET | ORAL | 0 refills | 8.00000 days | Status: AC | PRN
Start: 2023-02-16 — End: 2023-02-21

## 2023-02-16 MED ORDER — cefpodoxime (Vantin) 200 mg tablet
200 | ORAL_TABLET | Freq: Two times a day (BID) | ORAL | 0 refills | 6.50000 days | Status: AC
Start: 2023-02-16 — End: 2023-02-22

## 2023-02-16 MED ORDER — metroNIDAZOLE (Flagyl) 500 mg tablet
500 | ORAL_TABLET | Freq: Two times a day (BID) | ORAL | 0 refills | Status: AC
Start: 2023-02-16 — End: 2023-02-21

## 2023-02-16 MED FILL — HYDROMORPHONE 0.5 MG/0.5 ML INJECTION WRAPPER: 0.5 0.5 mg/0.5 mL | INTRAMUSCULAR | Qty: 0.5

## 2023-02-16 MED FILL — HEPARIN (PORCINE) 5,000 UNIT/ML INJECTION SOLUTION: 5000 5,000 unit/mL | INTRAMUSCULAR | Qty: 1

## 2023-02-16 MED FILL — CEFTRIAXONE IVPB 1 G MINI-BAG PLUS IN 100 ML NS: 1.0000 1.0000 g | Qty: 1

## 2023-02-16 MED FILL — METRONIDAZOLE 500 MG/100 ML IN SODIUM CHLOR(ISO) INTRAVENOUS PIGGYBACK: 500 500 mg/100 mL | INTRAVENOUS | Qty: 100

## 2023-02-16 MED FILL — ACETAMINOPHEN 325 MG TABLET: 325 325 mg | ORAL | Qty: 2

## 2023-02-16 MED FILL — OXYCODONE 5 MG TABLET: 5 5 mg | ORAL | Qty: 1

## 2023-02-16 NOTE — Other (Signed)
Patient Education  Table of Contents   Crohn's Disease    To view videos and all your education online visit,  https://pe.elsevier.com/[email or web address]  or scan this QR code with your smartphone.  Access to this content will expire in one year.  Crohn's Disease    Crohn's disease is a long-lasting (chronic) disease that affects the gastrointestinal (GI) tract. Crohn's disease often causes irritation and inflammation in the small intestine and the beginning of the large intestine, but it can affect any part of the GI tract. Crohn's disease is part of a group of illnesses that are known as inflammatory bowel disease (IBD).  Crohn's disease may start slowly and get worse over time. Symptoms may come and go. They may also go away for months or even years at a time (remission).  What are the causes?  The exact cause of this condition is not known. It may involve a response that causes your body's disease-fighting system (immune system) to attack healthy cells and tissues (autoimmune response). Bacteria, genes, and your environment may also play a role.  What increases the risk?  The following factors may make you more likely to develop this condition:   Having a family member who has Crohn's disease, another IBD, or an autoimmune condition.   Using products that contain nicotine or tobacco, such as cigarettes and e-cigarettes.   Being in your 92s.   Having [place] European ancestry.  What are the signs or symptoms?  The main symptoms of this condition involve your GI tract. These include:   Diarrhea.   Pain or cramping in the abdomen commonly felt in the lower right side of the abdomen.   Frequent watery or bloody stools.   Constipation. This may mean having:  ? Fewer bowel movements in a week than normal.  ? Difficulty having a bowel movement.   ? Stools that are dry, hard, or larger than normal.   Rectal bleeding or pain.   An urgent need to have a bowel movement.   The feeling that you are not finished having a bowel  movement.  Other symptoms may include:   Unexplained weight loss.   Tiredness (fatigue).   Fever.   Nausea or appetite loss.   Joint pain.   Vision changes.   Red bumps or sores on the skin.   Sores inside the mouth.  How is this diagnosed?  This condition may be diagnosed based on:   Your symptoms and medical history.   A physical exam.   Tests, which may include:  ? Blood tests.  ? Stool sample tests.  ? Imaging tests, such as X-rays and CT scans.  ? Tests to examine the inside of your intestines using a long, flexible tube that has a light and a camera on the end (colonoscopy).  ? A procedure to remove tissue samples from inside your bowel for testing (biopsy).  You may need to work with a health care provider who specializes in diseases of the digestive tract (gastroenterologist).  How is this treated?  There is no cure for this condition, and it affects each person differently. Treatment can help you manage your symptoms. Your treatment may include:   Medicines. These may be used by themselves or with other treatments (combination therapy). You may be given medicines that help to:  ? Reduce inflammation.  ? Control your immune system activity.  ? Fight infections.  ? Relieve cramps and prevent diarrhea.  ? Control your pain.   Surgery. You may need  surgery if:  ? Medicines and other treatments are not working anymore.  ? You develop complications from severe Crohn's disease.  ? A section of your intestine becomes so damaged that it needs to be removed.   Lifestyle changes:  ? Maintaining eating or drinking restrictions.  ? Reducing or eliminating use of alcohol or nicotine.  Follow these instructions at home:  Medicines   Take over-the-counter and prescription medicines only as told by your health care provider.   If you were prescribed an antibiotic, take it as told by your health care provider. Do not stop taking the antibiotic even if you start to feel better.   Avoid taking ibuprofen or other NSAID  medicines if possible. These can make Crohn's disease worse.  Eating and drinking   Talk with your health care provider or a registered dietitian about what diet is best for you.   Drink enough fluid to keep your urine pale yellow.   If you are taking steroids to reduce inflammation, get plenty of calcium in your diet to help keep your bones healthy. You may also consider taking a calcium supplement with vitamin D.   Keep a food diary to identify foods that make your symptoms better or worse, and avoid foods that cause symptoms.   Follow instructions from your health care provider about eating or drinking restrictions if you have worsening symptoms (flare-up).   If you drink alcohol:   ? Limit how much you have to:   ? 0?1 drink a day for women who are not pregnant.   ? 0?2 drinks a day for men.  ? Know how much alcohol is in a drink. In the [place] one drink equals one 12 oz bottle of beer (355 mL), one 5 oz glass of wine (148 mL), or one 1? oz glass of hard liquor (44 mL).  General instructions   Make sure you get all the vaccines that your health care provider recommends, especially pneumonia (pneumococcal) and flu (influenza) vaccines.   Do not use any products that contain nicotine or tobacco. These products include cigarettes, chewing tobacco, and vaping devices, such as e-cigarettes. If you need help quitting, ask your health care provider.   Exercise every day, or as often as told by your health care provider.   Keep all follow-up visits. This is important.  Contact a health care provider if:   You have diarrhea, cramps in your abdomen, and other GI problems that are present almost all the time.   Your symptoms do not improve with treatment, your symptoms get worse, or you develop new symptoms.   You cannot pass stools.   You continue to lose weight.   You develop a rash or sores on your skin.   You develop eye problems.   You have a fever.  Get help right away if:   You have bloody diarrhea.   You have severe  pain in your abdomen.  These symptoms may be an emergency. Get help right away. Call 911.   Do not wait to see if the symptoms will go away.   Do not drive yourself to the hospital.  Summary   Crohn's disease affects each person differently. The cause of this condition is not known, but it may involve a response that causes your body's immune system to attack healthy cells and tissues.   There are multiple treatment options that can help you manage the condition. Talk with your health care provider or a registered dietitian about  what diet is best for you.   Make sure you get all the vaccines that your health care provider recommends, especially pneumonia (pneumococcal) and flu (influenza) vaccines.   Get help right away if you have bloody diarrhea or severe pain in your abdomen.  This information is not intended to replace advice given to you by your health care provider. Make sure you discuss any questions you have with your health care provider.  Document Released: 2004-10-17 Document Updated: 2020-07-13 Document Reviewed: 2020-07-13  Elsevier Patient Education ? 2024 Elsevier Inc.

## 2023-02-16 NOTE — Care Plan (Addendum)
Goal Outcome Evaluation:  Plan of Care Reviewed With: patient  Progress: improving    Pt is alert and oriented times 4, VSS, continue to manage pain with PRN dilaudid. Continues to receive IV ABX, NSR on tele. IV fluids order has been expired, MD notified, no new order at this time. Safety maintained, call bell within reach. POC ongoing.      Problem: Adult Inpatient Plan of Care  Goal: Plan of Care Review  Outcome: Ongoing, Progressing  Flowsheets (Taken 02/16/2023 0412)  Progress: improving  Plan of Care Reviewed With: patient  Goal: Patient-Specific Goal (Individualized)  Outcome: Ongoing, Progressing  Goal: Absence of Hospital-Acquired Illness or Injury  Outcome: Ongoing, Progressing  Intervention: Identify and Manage Fall Risk  Recent Flowsheet Documentation  Taken 02/16/2023 0400 by Dian Situ, RN  Safety Promotion/Fall Prevention:   clutter-free environment maintained   safety round/check completed  Intervention: Prevent Skin Injury  Recent Flowsheet Documentation  Taken 02/16/2023 0400 by Dian Situ, RN  Body Position: position changed independently  Skin Protection:   adhesive use limited   drying agents applied  Intervention: Prevent and Manage VTE (Venous Thromboembolism) Risk  Recent Flowsheet Documentation  Taken 02/16/2023 0400 by Dian Situ, RN  Range of Motion: active ROM (range of motion) encouraged  Activity Expectations: activity adjusted per tolerance  VTE Prevention/Management: SCDs (sequential compression devices) off  Intervention: Prevent Infection  Recent Flowsheet Documentation  Taken 02/16/2023 0400 by Dian Situ, RN  Infection Prevention:   hand hygiene promoted   rest/sleep promoted   single patient room provided  Goal: Optimal Comfort and Wellbeing  Outcome: Ongoing, Progressing  Intervention: Monitor Pain and Promote Comfort  Recent Flowsheet Documentation  Taken 02/16/2023 0400 by Dian Situ, RN  Pain Management Interventions:    medication administered   care clustered  Taken 02/16/2023 0300 by Dian Situ, RN  Pain Management Interventions: medication administered  Intervention: Provide Person-Centered Care  Recent Flowsheet Documentation  Taken 02/16/2023 0400 by Dian Situ, RN  Trust Relationship/Rapport:   care explained   choices provided   emotional support provided   empathic listening provided   questions answered   questions encouraged   reassurance provided   thoughts/feelings acknowledged  Goal: Readiness for Transition of Care  Outcome: Ongoing, Progressing     Problem: Pain Acute  Goal: Acceptable Pain Control and Functional Ability  Outcome: Ongoing, Progressing  Intervention: Develop Pain Management Plan  Recent Flowsheet Documentation  Taken 02/16/2023 0400 by Dian Situ, RN  Pain Management Interventions:   medication administered   care clustered  Taken 02/16/2023 0300 by Dian Situ, RN  Pain Management Interventions: medication administered  Intervention: Prevent or Manage Pain  Recent Flowsheet Documentation  Taken 02/16/2023 0400 by Dian Situ, RN  Sleep/Rest Enhancement:   awakenings minimized   regular sleep/rest pattern promoted  Medication Review/Management: medications reviewed  Intervention: Optimize Psychosocial Wellbeing  Recent Flowsheet Documentation  Taken 02/16/2023 0400 by Dian Situ, RN  Supportive Measures:   active listening utilized   decision-making supported  Diversional Activities:   television   smartphone

## 2023-02-16 NOTE — Progress Notes (Addendum)
PROGRESS NOTE    Today's Date: 02/16/2023  MRN: [id number]  Name: Ellen Herrera  DOB: [birth year]  LOS: 0 Days   Subjective   Overnight events: no more fever   Complaints: Having abdominal pain, nausea, no BM  ROS: Negative except as stated above.      Objective   Exam:  BP 105/69 (BP Location: Right arm, Patient Position: Lying)   Pulse 96   Temp 37.1 C (98.7 F) (Oral)   Resp 18   Ht 1.778 m   Wt 68 kg   SpO2 99%   BMI 21.51 kg/m   Oxygen Therapy  SpO2: 99 %  Patient Activity During SpO2 Measurement: At rest  Oxygen Therapy: None (Room air)     Intake/Output Summary (Last 24 hours) at 02/16/2023 1038  Last data filed at 02/15/2023 1853  Gross per 24 hour   Intake 200 ml   Output --   Net 200 ml      Physical Exam  Constitutional:       Appearance: Normal appearance.   HENT:      Head: Atraumatic.   Eyes:      Pupils: Pupils are equal, round, and reactive to light.   Cardiovascular:      Rate and Rhythm: Normal rate and regular rhythm.   Pulmonary:      Breath sounds: Normal breath sounds.   Abdominal:      General: Bowel sounds are normal. There is no distension.      Palpations: Abdomen is soft.      Tenderness: There is abdominal tenderness. There is no guarding or rebound.   Musculoskeletal:      Cervical back: Neck supple.   Neurological:      General: No focal deficit present.      Mental Status: She is alert and oriented to person, place, and time.      Cranial Nerves: No cranial nerve deficit.      Motor: No weakness.      Gait: Gait normal.          Current medications:    Current Facility-Administered Medications:     acetaminophen (Tylenol) tablet 650 mg, 650 mg, oral, q6h PRN, Girtha Hake, MD, 650 mg at 02/15/23 1945    cefTRIAXone (Rocephin) 1 g in sodium chloride 0.9 % 100 mL IVPB-MBP, 1 g, intravenous, q24h, Girtha Hake, MD, Last Rate: 200 mL/hr at 02/15/23 1557, 1 g at 02/15/23 1557    drospirenone-ethinyl estradioL (Yaz, Gianvi) 3-0.02 mg per tablet 1 tablet, 1 tablet, oral, Nightly,  Azaliyah Kennard, MD, 1 tablet at 02/15/23 1944    heparin (porcine) injection 5,000 Units, 5,000 Units, subcutaneous, q8h [facility] Girtha Hake, MD, 5,000 Units at 02/16/23 7564    metroNIDAZOLE in NaCl (iso-os) (Flagyl) IVPB 500 mg, 500 mg, intravenous, q8h, Girtha Hake, MD, Last Rate: 100 mL/hr at 02/16/23 0308, 500 mg at 02/16/23 0308    ondansetron (Zofran) injection 4 mg, 4 mg, intravenous, q8h PRN, Girtha Hake, MD, 4 mg at 02/15/23 0501    oxyCODONE (Roxicodone) immediate release tablet 5 mg, 5 mg, oral, q4h PRN, Brande Uncapher, MD     Recent Results (from the past 24 hour(s))   Stool Pathogens Panel by PCR    Collection Time: 02/15/23  5:57 PM    Specimen: Per Rectum; Stool   Result Value Ref Range    Yersinia enterocolitica Not Detected Not Detected    Norovirus Not Detected Not Detected    Campylobacter Not Detected Not  Detected    Salmonella Not Detected Not Detected    Shigella Not Detected Not Detected    Shigatoxin 1 Not Detected Not Detected    Shigatoxin 2 Not Detected Not Detected    Vibrio Not Detected Not Detected    Rotavirus Not Detected Not Detected   Clostridioides difficile by PCR    Collection Time: 02/15/23  5:57 PM    Specimen: Per Rectum; Stool   Result Value Ref Range    C diff PCR Not Detected Not Detected   Comprehensive metabolic panel    Collection Time: 02/16/23  6:18 AM   Result Value Ref Range    Sodium 137 135 - 146 mmol/L    Potassium 3.8 3.6 - 5.2 mmol/L    Chloride 107 98 - 110 mmol/L    CO2 (Bicarbonate) 25 20 - 32 mmol/L    Anion Gap 5 3 - 14 mmol/L    BUN 7 6 - 24 mg/dL    Creatinine 1.32 4.40 - 1.30 mg/dL    eGFRcr 102 >=72 ZD/GUY/4.03K*7    Glucose 80 70 - 139 mg/dL    Fasting? Unknown     Calcium 8.1 (L) 8.5 - 10.5 mg/dL    AST 21 6 - 42 U/L    ALT 15 0 - 55 U/L    Alkaline phosphatase 46 30 - 130 U/L    Protein, total 5.9 (L) 6.0 - 8.4 g/dL    Albumin 2.4 (L) 3.2 - 5.0 g/dL    Bilirubin, total 0.2 0.2 - 1.2 mg/dL   CBC    Collection Time: 02/16/23  6:18 AM    Result Value Ref Range    WBC 7.8 4.0 - 11.0 K/uL    RBC 3.83 3.70 - 5.20 M/uL    Hemoglobin 10.9 (L) 11.0 - 16.0 g/dL    Hematocrit 42.5 95.6 - 47.0 %    MCV 88.5 80.0 - 100.0 fL    MCH 28.5 26.0 - 34.0 pg    MCHC 32.2 31.0 - 37.0 g/dL    RDW-SD 38.7 56.4 - 33.2 fL    RDW-CV 13.1 11.5 - 14.5 %    Platelets 219 150 - 400 K/uL    MPV 10.0 9.1 - 12.4 fL    NRBC % 0.0 0.0 - 0.0 %    NRBC Absolute 0.00 0.00 - 2.00 K/uL     CT ABDOMEN PELVIS W CONTRAST   Final Result   Mural thickening of the terminal ileum and ascending colon. The appendix is identified and appears normal. Findings are most compatible with a infectious or inflammatory ileitis and colitis rather than acute appendicitis.      Small amount of pelvic free fluid, possibly reactive.      Florencia Reasons, DO 02/15/2023 1:26 PM      CT ABDOMEN PELVIS W CONTRAST   Final Result   Significant wall thickening of the cecum, proximal ascending colon, and terminal ileum. There is a tubular structure measuring 6 to7 mm in the right lower quadrant near the cecum with intraluminal air, may represent a borderline dilated appendix. Constellation of these findings may represent acute inflammation involving the cecum, proximal right colon, and terminal ileum.      Small amount of free fluid in the pelvis.      Findings were discussed on 02/14/2023 5:13 PM with Cherlynn Kaiser, PA.          Gaspar Cola MD 02/14/2023 5:19 PM  Assessment    Principal Problem:    Colitis      23 year old female without any prior medical history complains of abdominal pain, fever, nausea, once episode of bloody BM  Admitted with ileocolitis           # Ileocolitis concern for IBD/crohn's/ infectious    #Appendicitis ruled out   Labs with leucocytoses, fever, ESR 30  CT a/p: Significant wall thickening of the cecum, proximal ascending colon, and terminal ileum, borderline dilated appendix. CT a/p was repeated continues to have thickening of the terminal ileum and ascending colon rest but  no signs of appendicitis   GI recommendation appreciated presentation likely secondary to Crohn's disease also she has a CT 3/24 and shows fecalization of the small bowel which may indicate chronic inflammatory process.  Leucocytoses resolved but still nausea and pain, no  more fever     Plan: Will continue IV hydration antibiotics with ceftriaxone and Flagyl, can send stool cultures but she has no diarrhea, send calprotectin.  Will advance the diet and switch pain medication to oral, if she can tolerate and pain improved plan is to discharge home and follow outpatient for colonoscopy.  If she is symptomatic and cannot tolerate p.o. intake GI will follow to give further recommendations.  Surgery recommendation appreciated        DVT prophylaxis: Heparin  Code Status and Discussions: Full Code   Social Determinants of Health that impact treatment or disposition: None  Shared decision making: Patient   Family Update:   Ongoing Medical Necessity for hospital stay: pain, advance diet   Discharge plan/Disposition: Not medically ready. TBD.       Will Bonnet, MD

## 2023-02-16 NOTE — Other (Signed)
Patient Education  Table of Contents   Crohn's Disease    To view videos and all your education online visit,  [email or web address].[email or web address]  or scan this QR code with your smartphone.  Access to this content will expire in one year.  Crohn's Disease    Crohn's disease is a long-lasting (chronic) disease that affects the gastrointestinal (GI) tract. Crohn's disease often causes irritation and inflammation in the small intestine and the beginning of the large intestine, but it can affect any part of the GI tract. Crohn's disease is part of a group of illnesses that are known as inflammatory bowel disease (IBD).  Crohn's disease may start slowly and get worse over time. Symptoms may come and go. They may also go away for months or even years at a time (remission).  What are the causes?  The exact cause of this condition is not known. It may involve a response that causes your body's disease-fighting system (immune system) to attack healthy cells and tissues (autoimmune response). Bacteria, genes, and your environment may also play a role.  What increases the risk?  The following factors may make you more likely to develop this condition:   Having a family member who has Crohn's disease, another IBD, or an autoimmune condition.   Using products that contain nicotine or tobacco, such as cigarettes and e-cigarettes.   Being in your 30s.   Having [place] European ancestry.  What are the signs or symptoms?  The main symptoms of this condition involve your GI tract. These include:   Diarrhea.   Pain or cramping in the abdomen commonly felt in the lower right side of the abdomen.   Frequent watery or bloody stools.   Constipation. This may mean having:  ? Fewer bowel movements in a week than normal.  ? Difficulty having a bowel movement.   ? Stools that are dry, hard, or larger than normal.   Rectal bleeding or pain.   An urgent need to have a bowel movement.   The feeling that you are not finished having a bowel  movement.  Other symptoms may include:   Unexplained weight loss.   Tiredness (fatigue).   Fever.   Nausea or appetite loss.   Joint pain.   Vision changes.   Red bumps or sores on the skin.   Sores inside the mouth.  How is this diagnosed?  This condition may be diagnosed based on:   Your symptoms and medical history.   A physical exam.   Tests, which may include:  ? Blood tests.  ? Stool sample tests.  ? Imaging tests, such as X-rays and CT scans.  ? Tests to examine the inside of your intestines using a long, flexible tube that has a light and a camera on the end (colonoscopy).  ? A procedure to remove tissue samples from inside your bowel for testing (biopsy).  You may need to work with a health care provider who specializes in diseases of the digestive tract (gastroenterologist).  How is this treated?  There is no cure for this condition, and it affects each person differently. Treatment can help you manage your symptoms. Your treatment may include:   Medicines. These may be used by themselves or with other treatments (combination therapy). You may be given medicines that help to:  ? Reduce inflammation.  ? Control your immune system activity.  ? Fight infections.  ? Relieve cramps and prevent diarrhea.  ? Control your pain.   Surgery. You may need  surgery if:  ? Medicines and other treatments are not working anymore.  ? You develop complications from severe Crohn's disease.  ? A section of your intestine becomes so damaged that it needs to be removed.   Lifestyle changes:  ? Maintaining eating or drinking restrictions.  ? Reducing or eliminating use of alcohol or nicotine.  Follow these instructions at home:  Medicines   Take over-the-counter and prescription medicines only as told by your health care provider.   If you were prescribed an antibiotic, take it as told by your health care provider. Do not stop taking the antibiotic even if you start to feel better.   Avoid taking ibuprofen or other NSAID  medicines if possible. These can make Crohn's disease worse.  Eating and drinking   Talk with your health care provider or a registered dietitian about what diet is best for you.   Drink enough fluid to keep your urine pale yellow.   If you are taking steroids to reduce inflammation, get plenty of calcium in your diet to help keep your bones healthy. You may also consider taking a calcium supplement with vitamin D.   Keep a food diary to identify foods that make your symptoms better or worse, and avoid foods that cause symptoms.   Follow instructions from your health care provider about eating or drinking restrictions if you have worsening symptoms (flare-up).   If you drink alcohol:   ? Limit how much you have to:   ? 0?1 drink a day for women who are not pregnant.   ? 0?2 drinks a day for men.  ? Know how much alcohol is in a drink. In the [place] one drink equals one 12 oz bottle of beer (355 mL), one 5 oz glass of wine (148 mL), or one 1? oz glass of hard liquor (44 mL).  General instructions   Make sure you get all the vaccines that your health care provider recommends, especially pneumonia (pneumococcal) and flu (influenza) vaccines.   Do not use any products that contain nicotine or tobacco. These products include cigarettes, chewing tobacco, and vaping devices, such as e-cigarettes. If you need help quitting, ask your health care provider.   Exercise every day, or as often as told by your health care provider.   Keep all follow-up visits. This is important.  Contact a health care provider if:   You have diarrhea, cramps in your abdomen, and other GI problems that are present almost all the time.   Your symptoms do not improve with treatment, your symptoms get worse, or you develop new symptoms.   You cannot pass stools.   You continue to lose weight.   You develop a rash or sores on your skin.   You develop eye problems.   You have a fever.  Get help right away if:   You have bloody diarrhea.   You have severe  pain in your abdomen.  These symptoms may be an emergency. Get help right away. Call 911.   Do not wait to see if the symptoms will go away.   Do not drive yourself to the hospital.  Summary   Crohn's disease affects each person differently. The cause of this condition is not known, but it may involve a response that causes your body's immune system to attack healthy cells and tissues.   There are multiple treatment options that can help you manage the condition. Talk with your health care provider or a registered dietitian about  what diet is best for you.   Make sure you get all the vaccines that your health care provider recommends, especially pneumonia (pneumococcal) and flu (influenza) vaccines.   Get help right away if you have bloody diarrhea or severe pain in your abdomen.  This information is not intended to replace advice given to you by your health care provider. Make sure you discuss any questions you have with your health care provider.  Document Released: 2004-10-17 Document Updated: 2020-07-13 Document Reviewed: 2020-07-13  Elsevier Patient Education ? 2024 [employer]

## 2023-02-16 NOTE — Discharge Instructions (Signed)
Fiber diet  Follow-up with GI outpatient to arrange for colonoscopy  Complete antibiotics

## 2023-02-16 NOTE — Progress Notes (Signed)
 GENERAL SURGERY PROGRESS NOTE     SUBJECTIVE:  Patient feeling better.  Endorsing off and on pain.    PROCEDURE(S):    from No surgery found    DIAGNOSIS:  Ileocolitis    PHYSICAL EXAM:   Temp:  [36.8 C (98.3 F)-37.4 C (99.3 F)] 37.1 C (98.7 F)  Pulse:  [85-130] 96  Resp:  [18-20] 18  SpO2:  [98 %-100 %] 99 %  BP: (102-106)/(60-70) 105/69    GENERAL:  Uncomfortable appearing  HEENT: NCAT, MMM  CARDIAC: NSR on monitor and RRR, radial pulses palpable and symmetric  PULM: No increased work of breathing, no accessory muscle use and comfortable on room air  GI: Soft, nondistended, TTP in RLQ but no rebound tenderness or peritoneal signs  GU: Deferred  EXT: Moving all extremities and Warm and well perfused  NEURO: No focal deficits    Objective   LABS:  7.8 \ 10.9 / 219    / 33.9 \     137 107 7 / 80   3.8 25 0.55 \     -8.1   -No results found for requested labs within last 365 days.   -No results found for requested labs within last 365 days.          KEY IMAGING:  No new imaging results       ASSESSMENT AND PLAN:   23 y.o.female with no significant pmh on whom general surgery has been consulted for c/f colitis.     Pt HDS with normalized WBC. CTAP with IV co yesterday shows a normal appendix and most compatible with infectious/inflammatory ileitis/colitis rather than acute appendicitis. Given these findings and that pt is clinically improving, there is no indication for acute surgical intervention. Per GI recs, pt can plan to f/u with colonoscopy. Surgery will sign off at this time.     Patient discussed with Dr. Jonah Blue, MD  General Surgery Resident

## 2023-02-16 NOTE — Progress Notes (Signed)
[facility]   Inpatient Progress Note    Ellen Herrera is a 23 y.o. year old female patient admitted with RLQ pain, ileocolitis.    GI is following for: RLQ pain, ileocolitis    Daily Update:  02/16/23    HPI:  Doing better today.  Has milder abdominal cramps.  No fever.  She had some nausea but no vomiting.  Tolerating regular diet.  Had loose BM but no melena or hematochezia.  Wanting to go home.      Review of systems:   GEN: NO weight or appetite change. NO fever, chills or sweats   EYES: no vision changes   ENT: No epistaxis or sore throat   LUNGS: NO cough or dyspnea   HEART: NO palpitations or chest discomfort or orthopnea   ABD: see HPI   GU: NO dysuria, or polyuria   MSK: NO arthralgia or myalgia   SKIN: NO rashes, bruising, or pruritus   NEURO: NO memory loss, syncope, vertigo, or paresthesias   PSYCH: NO depression or anxiety       Physical Exam  Visit Vitals  BP 109/60 (BP Location: Right arm, Patient Position: Lying)   Pulse 90   Temp 36.8 C (98.2 F) (Oral)   Resp 17   Ht 1.778 m   Wt 68 kg   SpO2 99%   BMI 21.51 kg/m   BSA 1.83 m       GENERAL APPEARANCE: Well appearing, alert, in no acute distress, well-hydrated, well nourished.  SKIN: Skin color, texture, turgor normal, no suspicious rashes or lesions.   EYES: Anicteric sclera. Pupils are equally round and reactive to light. Extraocular movements are intact.  LUNGS: No accessory muscle use, no cyanosis, no asymmetric calf swelling, no bony deformity.   HEART: Normal JVP, no signficant peripheral edema, no thrill.  ABDOMEN: Abdomen soft, non-tender, non-distended. No masses, ascites or hepatosplenomegaly.   EXTREMITIES: No deformities, edema, skin discoloration, clubbing or cyanosis.       MEDS    Current Facility-Administered Medications:     acetaminophen (Tylenol) tablet 650 mg, 650 mg, oral, q6h PRN, Girtha Hake, MD, 650 mg at 02/15/23 1945    cefTRIAXone (Rocephin) 1 g in sodium chloride 0.9 % 100 mL IVPB-MBP, 1 g,  intravenous, q24h, Evgjeni Xhoxhi, MD, Last Rate: 200 mL/hr at 02/16/23 1507, 1 g at 02/16/23 1507    drospirenone-ethinyl estradioL (Yaz, Gianvi) 3-0.02 mg per tablet 1 tablet, 1 tablet, oral, Nightly, Evgjeni Xhoxhi, MD, 1 tablet at 02/15/23 1944    heparin (porcine) injection 5,000 Units, 5,000 Units, subcutaneous, q8h [facility], Girtha Hake, MD, 5,000 Units at 02/16/23 1417    metroNIDAZOLE in NaCl (iso-os) (Flagyl) IVPB 500 mg, 500 mg, intravenous, q8h, Girtha Hake, MD, Last Rate: 100 mL/hr at 02/16/23 1132, 500 mg at 02/16/23 1132    ondansetron (Zofran) injection 4 mg, 4 mg, intravenous, q8h PRN, Girtha Hake, MD, 4 mg at 02/15/23 0501    oxyCODONE (Roxicodone) immediate release tablet 5 mg, 5 mg, oral, q4h PRN, Evgjeni Xhoxhi, MD, 5 mg at 02/16/23 1625    Current Outpatient Medications:     [START ON 02/18/2023] cefpodoxime (Vantin) 200 mg tablet, Take 1 tablet (200 mg) by mouth twice daily for 4 days., Disp: 8 tablet, Rfl: 0    drospirenone-ethinyl estradioL (Yaz, Gianvi) 3-0.02 mg tablet, Take 1 tablet by mouth once daily., Disp: , Rfl:     hydrOXYzine HCL (Atarax) 25 mg tablet, Take 25 mg by mouth if needed each day  for itching., Disp: , Rfl:     metroNIDAZOLE (Flagyl) 500 mg tablet, Take 1 tablet (500 mg) by mouth twice daily for 5 days., Disp: 10 tablet, Rfl: 0    oxyCODONE (Roxicodone) 5 mg immediate release tablet, Take 1 tablet (5 mg) by mouth every 4 (four) hours if needed for pain score 7-10 (severe) for up to 5 days., Disp: 12 tablet, Rfl: 0        Laboratory Data:  No components found for: "CBC"  No results found for: "INR"  Lab Results   Component Value Date    AST 21 02/16/2023    ALT 15 02/16/2023       Diagnostics  See updated CT report negative for appendicitis      ASSESSMENT and PLAN  25F, previously healthy, presenting for 3-day history of RLQ pain with imaging concerning for ileocolitis between the TI and proximal ascending colon and question of possible appendicitis.      -Ileocolitis  She has imaging findings of inflammation affecting the TI, cecum and proximal ascending colon.  Her current presentation is more in keeping with Crohn's disease rather than acute appendicitis.  I reviewed her CT from 03/2022 that showed fecalization of the small bowel which may indicate chronic luminal narrowing in this area which would indicate a chronic inflammatory process rather than acute issues with the appendix.     As well, her ESR and CRP are elevated.  Would recommend checking fecal calprotectin.  She does not report any diarrhea but stool infectious workup may be considered but is likely low yield.     Repeat CT scan was negative for appendicitis.  If she is feeling well, she can be discharged for outpatient follow-up.  Would recommend a colonoscopy in the short-term as an outpatient for further evaluation for ileocolic Crohn's.  I will arrange this in the coming week or 2.  She can complete a course of antibiotics for suspected infectious colitis.  We also discussed low residue diet in the interim.    Thank you for involving me in the care of this patient.    Birdena Crandall, MD MPH [facility]  Interventional Endoscopy, Gastroenterology and Hepatology

## 2023-02-16 NOTE — Discharge Summary (Signed)
 DISCHARGE SUMMARY  [facility]  [address]  [place] [address]  [phone]          Name: Ellen Herrera  DOB: [birth year]  MRN: [id number]  CSN: [id number]    Date of Admission 02/14/2023  Date of Discharge 02/16/2023    Attending Physician Will Bonnet, MD  Discharge Physician Will Bonnet, MD    Chief Complaint/Reason for Admission  Chief Complaint   Patient presents with    Abdominal Pain       Discharge Diagnosis  Colitis     History of Present Illness  Ellen Herrera, 23 year old female without any prior medical history complains of abdominal pain  Abdominal pain for the past 4 days, symptoms initially started with muscle ache/shivering and a low-grade fever followed by abdominal pain, predominantly in the lower part, radiating to the back, initially was 6 x 10, gradually got worse to 9 x 10, sometimes radiates up towards, associated temperature 100.8, denies any nausea vomiting, she initially had symptoms of trapped gas' twice, denies any diarrhea/blood in the stool, headache present, denies any dizziness focal weakness, bleeding in the stool or black stools or hematuria  Patient takes oral contraceptives  Maternal grandmother had pancreatic cancer, is 60 and a survivor     ED course  Vital signs 100/min respiratory 29, 109/73, 100% on room air  Labs hyponatremia 134, potassium 4.1, BUN/creatinine 7/0.69, lactic acid 0.9, normal liver functions, leukocytosis 13.3, 76% neutrophils  Blood culture pending  hCG negative   CT abdomen pelvis with contrast  Significant wall thickening of the cecum, proximal ascending colon, and terminal ileum. There is a tubular structure measuring 6 to7 mm in the right lower quadrant near the cecum with intraluminal air, may represent a borderline dilated appendix. Constellation of these findings may represent acute inflammation involving the cecum, proximal right colon, and terminal ileum.  Small amount of free fluid in the pelvis.     Surgery was  consulted in the ED recommended conservative management and no acute surgical intervention at this time     Admitted for colitis    Hospital Course  23 year old female without any prior medical history complains of abdominal pain, fever, nausea, once episode of bloody BM  Admitted with ileocolitis            # Ileocolitis concern for IBD/crohn's/ infectious    #Appendicitis ruled out   Labs with leucocytoses, fever, ESR 30  CT a/p: Significant wall thickening of the cecum, proximal ascending colon, and terminal ileum, borderline dilated appendix. CT a/p was repeated continues to have thickening of the terminal ileum and ascending colon rest but no signs of appendicitis   GI recommendation appreciated presentation likely secondary to Crohn's disease also she has a CT 3/24 and shows fecalization of the small bowel which may indicate chronic inflammatory process.  Leucocytoses resolved but still nausea and pain, no  more fever      Received V hydration antibiotics with ceftriaxone and Flagyl, stool pathogen /c. diff negative, follow calprotectin.  Diet was advanced, she is tolerating, pain improved, spoke with GI ok to dc home and follow outpatient for colonoscopy.  Complete 7 days abx. Will give oral oxycodone   Surgery recommendation appreciated             Pertinent Physical Exam At Time of Discharge  BP 105/69 (BP Location: Right arm, Patient Position: Lying)   Pulse 96   Temp 37.1 C (98.7 F) (Oral)   Resp 18   Ht  1.778 m   Wt 68 kg   SpO2 99%   BMI 21.51 kg/m   Physical Exam  Constitutional:       Appearance: Normal appearance.   HENT:      Head: Atraumatic.   Eyes:      Pupils: Pupils are equal, round, and reactive to light.   Cardiovascular:      Rate and Rhythm: Normal rate and regular rhythm.   Pulmonary:      Breath sounds: Normal breath sounds.   Abdominal:      General: Bowel sounds are normal. There is no distension.      Palpations: Abdomen is soft.      Tenderness: There is no guarding or  rebound.   Musculoskeletal:      Cervical back: Neck supple.   Neurological:      General: No focal deficit present.      Mental Status: She is alert.             Test Results Pending At Discharge  Pending Labs       Order Current Status    Calprotectin, Stool In process    Blood culture, peripheral #1 Preliminary result    Blood culture, peripheral #2 Preliminary result              Advance Care Plan  Extended Emergency Contact Information  Primary Emergency Contact: Atmore,Taryn  Mobile Phone: [phone]  Relation: Roommate  Preferred language: English  Interpreter needed? No  Full Code  Advance Directives (For Healthcare)  Advance Directive: Patient has advance directive, copy not in chart  MIPS STANDARD STATEMENT- ADVANCE CARE PLAN:  I confirmed that the patient's Advance Care Plan is present, code status is documented, or surrogate decision maker is listed in the patient's medical record.        Medications on Discharge:  REFER TO MED LIST IN AFTER VISIT SUMMARY FOR COMPLETE DETAILS/DOSE INSTRUCTIONS.      Your medication list        START taking these medications        Instructions Last Dose Given Next Dose Due   cefpodoxime 200 mg tablet  Commonly known as: Vantin  Start taking on: February 18, 2023      Take 1 tablet (200 mg) by mouth twice daily for 4 days.       metroNIDAZOLE 500 mg tablet  Commonly known as: Flagyl      Take 1 tablet (500 mg) by mouth twice daily for 5 days.       oxyCODONE 5 mg immediate release tablet  Commonly known as: Roxicodone      Take 1 tablet (5 mg) by mouth every 4 (four) hours if needed for pain score 7-10 (severe) for up to 5 days.              CONTINUE taking these medications        Instructions Last Dose Given Next Dose Due   drospirenone-ethinyl estradioL 3-0.02 mg tablet  Commonly known as: Joselyn Arrow      Take 1 tablet by mouth once daily.       hydrOXYzine HCL 25 mg tablet  Commonly known as: Atarax      Take 25 mg by mouth if needed each day for itching.                  Where to Get Your Medications        These medications were sent to [facility] [phone] -  [place], [place] - [address] AT [facility] [address]  [address], [place] [address]      Phone: [phone]   cefpodoxime 200 mg tablet  metroNIDAZOLE 500 mg tablet  oxyCODONE 5 mg immediate release tablet            MIPS STANDARD STATEMENT - Documentation of Current Medications in the MEDICAL RECORD NUMBER I have utilized all available immediate resources to obtain, update, or review the patient's current medications (including all prescriptions, over-the-counter products, herbals, cannabis/cannabidiol products, and vitamin/mineral/dietary (nutritional) supplements).    Relevant Lab/Imaging Data:    Recent Results (from the past 24 hour(s))   Stool Pathogens Panel by PCR    Collection Time: 02/15/23  5:57 PM    Specimen: Per Rectum; Stool   Result Value Ref Range    Yersinia enterocolitica Not Detected Not Detected    Norovirus Not Detected Not Detected    Campylobacter Not Detected Not Detected    Salmonella Not Detected Not Detected    Shigella Not Detected Not Detected    Shigatoxin 1 Not Detected Not Detected    Shigatoxin 2 Not Detected Not Detected    Vibrio Not Detected Not Detected    Rotavirus Not Detected Not Detected   Clostridioides difficile by PCR    Collection Time: 02/15/23  5:57 PM    Specimen: Per Rectum; Stool   Result Value Ref Range    C diff PCR Not Detected Not Detected   Comprehensive metabolic panel    Collection Time: 02/16/23  6:18 AM   Result Value Ref Range    Sodium 137 135 - 146 mmol/L    Potassium 3.8 3.6 - 5.2 mmol/L    Chloride 107 98 - 110 mmol/L    CO2 (Bicarbonate) 25 20 - 32 mmol/L    Anion Gap 5 3 - 14 mmol/L    BUN 7 6 - 24 mg/dL    Creatinine 9.14 7.82 - 1.30 mg/dL    eGFRcr 956 >=21 HY/QMV/7.84O*9    Glucose 80 70 - 139 mg/dL    Fasting? Unknown     Calcium 8.1 (L) 8.5 - 10.5 mg/dL    AST 21 6 - 42 U/L    ALT 15 0 - 55 U/L    Alkaline phosphatase 46 30 - 130 U/L    Protein, total 5.9 (L) 6.0 -  8.4 g/dL    Albumin 2.4 (L) 3.2 - 5.0 g/dL    Bilirubin, total 0.2 0.2 - 1.2 mg/dL   CBC    Collection Time: 02/16/23  6:18 AM   Result Value Ref Range    WBC 7.8 4.0 - 11.0 K/uL    RBC 3.83 3.70 - 5.20 M/uL    Hemoglobin 10.9 (L) 11.0 - 16.0 g/dL    Hematocrit 62.9 52.8 - 47.0 %    MCV 88.5 80.0 - 100.0 fL    MCH 28.5 26.0 - 34.0 pg    MCHC 32.2 31.0 - 37.0 g/dL    RDW-SD 41.3 24.4 - 01.0 fL    RDW-CV 13.1 11.5 - 14.5 %    Platelets 219 150 - 400 K/uL    MPV 10.0 9.1 - 12.4 fL    NRBC % 0.0 0.0 - 0.0 %    NRBC Absolute 0.00 0.00 - 2.00 K/uL       CT ABDOMEN PELVIS W CONTRAST   Final Result   Mural thickening of the terminal ileum and ascending colon. The appendix is identified and appears normal. Findings  are most compatible with a infectious or inflammatory ileitis and colitis rather than acute appendicitis.      Small amount of pelvic free fluid, possibly reactive.      Florencia Reasons, DO 02/15/2023 1:26 PM      CT ABDOMEN PELVIS W CONTRAST   Final Result   Significant wall thickening of the cecum, proximal ascending colon, and terminal ileum. There is a tubular structure measuring 6 to7 mm in the right lower quadrant near the cecum with intraluminal air, may represent a borderline dilated appendix. Constellation of these findings may represent acute inflammation involving the cecum, proximal right colon, and terminal ileum.      Small amount of free fluid in the pelvis.      Findings were discussed on 02/14/2023 5:13 PM with Cherlynn Kaiser, PA.          Gaspar Cola MD 02/14/2023 5:19 PM           Issues Requiring Follow-Up  Fiber diet  Follow-up with GI outpatient to arrange for colonoscopy  Complete antibiotics    Time spent on Discharge:  >30 min     Incidental Findings     N/a     Outpatient Follow-Up  No future appointments.        MIPS STANDARD STATEMENT - HEART FAILURE:  N/A; no CHF or EF> 40%     Will Bonnet, MD

## 2023-02-16 NOTE — Nursing Note (Signed)
Pt is A+Ox4 and Vs noted. No episodes of n/v on shift. C/o pain medicated with PRN oxycodone. Pt d/c to home with family. IV removed and AVS reviewed.

## 2023-02-18 ENCOUNTER — Inpatient Hospital Stay
Admit: 2023-02-18 | Discharge: 2023-02-18 | Payer: PRIVATE HEALTH INSURANCE | Attending: Internal Medicine | Primary: Internal Medicine

## 2023-02-18 DIAGNOSIS — K529 Noninfective gastroenteritis and colitis, unspecified: Secondary | ICD-10-CM

## 2023-02-18 MED ORDER — lidocaine PF (Xylocaine) 20 mg/mL (2 %) injection
20 | INTRAMUSCULAR | Status: DC | PRN
Start: 2023-02-18 — End: 2023-02-18
  Administered 2023-02-18: 17:00:00 40 via INTRAVENOUS

## 2023-02-18 MED ORDER — propofol (Diprivan) injection
10 | INTRAVENOUS | Status: DC | PRN
Start: 2023-02-18 — End: 2023-02-18
  Administered 2023-02-18 (×2): 150 via INTRAVENOUS

## 2023-02-18 MED ORDER — budesonide EC (Entocort EC) 3 mg 24 hr capsule
3 | ORAL_CAPSULE | ORAL | 0 refills | 30.00000 days | Status: DC
Start: 2023-02-18 — End: 2023-05-12

## 2023-02-18 NOTE — H&P (Signed)
Endoscopy Physician Short Form      Scheduled Procedure: COLONOSCOPY    Diagnosis:   Ileocolitis    ASA Class: See anesthesia record  Mallampati: See anesthesia record    Allergy:  Allergies   Allergen Reactions    Amoxicillin         Home Medications: Reviewed  Patient's Medications   New Prescriptions    No medications on file   Previous Medications    CEFPODOXIME (VANTIN) 200 MG TABLET    Take 1 tablet (200 mg) by mouth twice daily for 4 days.    DROSPIRENONE-ETHINYL ESTRADIOL (YAZ, GIANVI) 3-0.02 MG TABLET    Take 1 tablet by mouth once daily.    HYDROXYZINE HCL (ATARAX) 25 MG TABLET    Take 25 mg by mouth if needed each day for itching.    METRONIDAZOLE (FLAGYL) 500 MG TABLET    Take 1 tablet (500 mg) by mouth twice daily for 5 days.    OXYCODONE (ROXICODONE) 5 MG IMMEDIATE RELEASE TABLET    Take 1 tablet (5 mg) by mouth every 4 (four) hours if needed for pain score 7-10 (severe) for up to 5 days.   Modified Medications    No medications on file   Discontinued Medications    No medications on file        Medical History:   Past Medical History:   Diagnosis Date    Anxiety         Physical Exam:   GEN: In no acute distress, alert and oriented     EYES: No conjunctival pallor, no scleral icterus     CV: No abnormalities noted. No LE edema     PULM: No abnormalities noted; No respiratory distress     ABD: No abnormalities noted. Soft, nontender and non-distended.   No guarding, rebound or rigidity.     PSYCH: Mood and affect are appropriate     Plan:   COLONOSCOPY      Birdena Crandall, MD

## 2023-02-18 NOTE — Telephone Encounter (Signed)
Pls book OV f/u in 1-2 months re ? Crohn's

## 2023-02-18 NOTE — Anesthesia Post-Procedure Evaluation (Signed)
Patient: Ellen Herrera    Procedure Summary       Date: 02/18/23 Room / Location: [facility] Endoscopy    Anesthesia Start: 1130 Anesthesia Stop: 1151    Procedure: COLONOSCOPY Diagnosis:       Ileocolitis      (Recent admission with RLQ pain and concern for ileocolitis. CRP elevated at 14. Stool infectious work up negative.)    Scheduled Providers: Birdena Crandall, MD; Evaristo Bury, MD; Jaynie Crumble, CRNA Responsible Provider: Evaristo Bury, MD    Anesthesia Type: MAC ASA Status: 2            Anesthesia Type: MAC    Vitals Value Taken Time   BP 99/58 02/18/23 1155   Temp 36.3 C (97.4 F) 02/18/23 1155   Pulse 75 02/18/23 1155   Resp 18 02/18/23 1155   SpO2 99 % 02/18/23 1155       Anesthesia Post Evaluation Note:    Patient location during evaluation: Endo Recovery  Patient participation: able to participate  Level of consciousness: arousable  Cardiovascular and Hydration status: stable and vital signs within acceptable range  Respiratory Status Stable and Airway Patent: yes  Nausea and Vomiting Control Satisfactory: yes  Pain score: 0  Pain management: adequate     Vitals reviewed: yes  Unplanned ICU Admission: noPatient has recovered from anesthesia and has returned to baseline mental status, cardiovascular and respiratory function. Pain, nausea, and vomiting are adequately controlled and the patient is adequately hydrated and appropriate for discharge from PACU?: yes      There were no known notable events for this encounter.

## 2023-02-18 NOTE — Anesthesia Pre-Procedure Evaluation (Signed)
Patient: Taitum Menton    Procedure Information       Date/Time: 02/18/23 1130    Scheduled providers: Birdena Crandall, MD; Evaristo Bury, MD; Jaynie Crumble, CRNA    Procedure: COLONOSCOPY    Location: [facility]        Diagnosis  1. Ileocolitis      Allergies  Allergies   Allergen Reactions    Amoxicillin      Past Medical History  Past Medical History:   Diagnosis Date    Anxiety      Problem List  Patient Active Problem List   Diagnosis    Colitis     Past Surgical History  No past surgical history on file.  Social History  Social History     Tobacco Use    Smoking status: Never    Smokeless tobacco: Never   Substance Use Topics    Alcohol use: Yes    Drug use: Never     Types: Other       Medications:  Prior to Admission  (Not in a hospital admission)    Scheduled    Continuous    PRN      Most Recent Labs  Sodium   Date/Time Value Ref Range Status   02/16/2023 06:18 AM 137 135 - 146 mmol/L Final     Potassium   Date/Time Value Ref Range Status   02/16/2023 06:18 AM 3.8 3.6 - 5.2 mmol/L Final     Chloride   Date/Time Value Ref Range Status   02/16/2023 06:18 AM 107 98 - 110 mmol/L Final     CO2 (Bicarbonate)   Date/Time Value Ref Range Status   02/16/2023 06:18 AM 25 20 - 32 mmol/L Final     BUN   Date/Time Value Ref Range Status   02/16/2023 06:18 AM 7 6 - 24 mg/dL Final     Creatinine   Date/Time Value Ref Range Status   02/16/2023 06:18 AM 0.55 0.55 - 1.30 mg/dL Final     Glucose   Date/Time Value Ref Range Status   02/16/2023 06:18 AM 80 70 - 139 mg/dL Final     WBC   Date/Time Value Ref Range Status   02/16/2023 06:18 AM 7.8 4.0 - 11.0 K/uL Final     Hemoglobin   Date/Time Value Ref Range Status   02/16/2023 06:18 AM 10.9 (L) 11.0 - 16.0 g/dL Final     Hematocrit   Date/Time Value Ref Range Status   02/16/2023 06:18 AM 33.9 32.0 - 47.0 % Final     Platelets   Date/Time Value Ref Range Status   02/16/2023 06:18 AM 219 150 - 400 K/uL Final     Pregnancy Status  hCG,  serum, quantitative   Date/Time Value Ref Range Status   02/14/2023 03:55 PM <1 <=5 mIU/mL Final       Cardiac  No results found for this or any previous visit (from the past 4464 hour(s)).    No echocardiogram results found for the past 12 months    Relevant Problems   Anesthesia (within normal limits)       Clinical information reviewed:                    Physical Exam    Airway  Mallampati: II  TM distance: >3 FB  Neck ROM: full     Cardiovascular - normal exam     Dental - normal exam     Pulmonary -  normal exam     Abdominal - normal exam     General                  Anesthesia Plan    ASA 2     MAC     Airway: natural airway  Monitoring: standard monitors  Postoperative Pain Control: IV/PO analgesics      Anesthetic plan and risks discussed with patient.             Additional Equipment Requests

## 2023-02-19 LAB — CALPROTECTIN, STOOL: Calprotectin Fecal: 69 ug/g (ref 0–120)

## 2023-02-20 LAB — BLOOD CULTURE
Blood Culture: NO GROWTH
Blood Culture: NO GROWTH

## 2023-02-26 NOTE — Telephone Encounter (Signed)
 LVM to schedule OV

## 2023-02-27 LAB — TISSUE PATHOLOGY

## 2023-03-05 NOTE — Telephone Encounter (Signed)
Please follow-up to book OV for any opening in the next few weeks

## 2023-03-06 NOTE — Telephone Encounter (Signed)
The patient is scheduled for a new patient appointment for a follow-up from colonoscopy and crohn's disease. The patient is requesting a sooner appointment before 7/23 with Dr. Irish Lack. The patient is having symptoms of nausea, cramping and abdominal pain. Please advise         Call back number: [phone]

## 2023-03-06 NOTE — Telephone Encounter (Signed)
Patient is scheduled for 2/26.

## 2023-03-06 NOTE — Telephone Encounter (Signed)
Unable to LVM mailbox is full  Letter sent

## 2023-03-06 NOTE — Telephone Encounter (Signed)
SWP scheduled follow up for 2/26

## 2023-03-14 LAB — BMP (EXT)
Anion Gap (EXT): 9 mmol/L (ref 3–17)
BUN (EXT): 10 mg/dL (ref 8–25)
CO2 (EXT): 25 mmol/L (ref 23–32)
CalciumCalcium (EXT): 9.3 mg/dL (ref 8.5–10.5)
Chloride (EXT): 105 mmol/L (ref 98–108)
Creatinine (EXT): 0.67 mg/dL (ref 0.50–1.00)
GFR Estimated (Calc) (EXT): >120 mL/min/{1.73_m2} (ref >59)
Glucose (EXT): 101 mg/dL (ref 70–110)
Potassium (EXT): 4.2 mmol/L (ref 3.4–5.0)
Sodium (EXT): 139 mmol/L (ref 135–145)

## 2023-03-15 ENCOUNTER — Inpatient Hospital Stay
Admit: 2023-03-15 | Discharge: 2023-03-16 | Disposition: A | Discharge status: Home / Self Care | Payer: PRIVATE HEALTH INSURANCE

## 2023-03-15 ENCOUNTER — Emergency Department: Admit: 2023-03-15 | Payer: PRIVATE HEALTH INSURANCE | Primary: Internal Medicine

## 2023-03-15 DIAGNOSIS — M545 Low back pain, unspecified: Secondary | ICD-10-CM

## 2023-03-15 LAB — CBC WITH DIFFERENTIAL
Basophils %: 0.6 %
Basophils Absolute: 0.04 10*3/uL (ref 0.00–0.22)
Eosinophils %: 1 %
Eosinophils Absolute: 0.07 10*3/uL (ref 0.00–0.50)
Hematocrit: 36.9 % (ref 32.0–47.0)
Hemoglobin: 12.2 g/dL (ref 11.0–16.0)
Immature Granulocytes %: 0.1 %
Immature Granulocytes Absolute: 0.01 10*3/uL (ref 0.00–0.10)
Lymphocyte %: 40.7 %
Lymphocytes Absolute: 2.91 10*3/uL (ref 0.70–4.00)
MCH: 28.3 pg (ref 26.0–34.0)
MCHC: 33.1 g/dL (ref 31.0–37.0)
MCV: 85.6 fL (ref 80.0–100.0)
MPV: 10 fL (ref 9.1–12.4)
Monocytes %: 6.7 %
Monocytes Absolute: 0.48 10*3/uL (ref 0.36–0.77)
NRBC %: 0 % (ref 0.0–0.0)
NRBC Absolute: 0 10*3/uL (ref 0.00–2.00)
Neutrophil %: 50.9 %
Neutrophils Absolute: 3.64 10*3/uL (ref 1.50–7.95)
Platelets: 288 10*3/uL (ref 150–400)
RBC: 4.31 M/uL (ref 3.70–5.20)
RDW-CV: 13.3 % (ref 11.5–14.5)
RDW-SD: 41.7 fL (ref 35.0–51.0)
WBC: 7.2 10*3/uL (ref 4.0–11.0)

## 2023-03-15 LAB — COMPREHENSIVE METABOLIC PANEL
ALT: 18 U/L (ref 0–55)
AST: 17 U/L (ref 6–42)
Albumin: 3.5 g/dL (ref 3.2–5.0)
Alkaline phosphatase: 54 U/L (ref 30–130)
Anion Gap: 7 mmol/L (ref 3–14)
BUN: 9 mg/dL (ref 6–24)
Bilirubin, total: 0.3 mg/dL (ref 0.2–1.2)
CO2 (Bicarbonate): 25 mmol/L (ref 20–32)
Calcium: 9.2 mg/dL (ref 8.5–10.5)
Chloride: 107 mmol/L (ref 98–110)
Creatinine: 0.67 mg/dL (ref 0.55–1.30)
Glucose: 84 mg/dL (ref 70–139)
Potassium: 4.6 mmol/L (ref 3.6–5.2)
Protein, total: 7 g/dL (ref 6.0–8.4)
Sodium: 139 mmol/L (ref 135–146)
eGFRcr: 126 mL/min/{1.73_m2} (ref >=60)

## 2023-03-15 LAB — URINALYSIS REFLEX TO CULTURE
Bilirubin, Ur: NEGATIVE
Blood, Ur: NEGATIVE
Glucose,Ur: NEGATIVE mg/dL
Ketones, Ur: NEGATIVE mg/dL
Leukocyte Esterase, Ur: NEGATIVE WBC/uL
Nitrite, Ur: NEGATIVE
Protein,Ur: NEGATIVE mg/dL
Specific Gravity, Ur: <=1.005 (ref 1.005–1.030)
Urobilinogen, Ur: 0.2 U/dL (ref 0.2–1.0)
pH, Ur: 7 (ref 5.0–8.0)

## 2023-03-15 LAB — LIPASE: Lipase: 54 U/L (ref 13–75)

## 2023-03-15 LAB — HCG, QUANTITATIVE, PREGNANCY: hCG, serum, quantitative: <1 m[IU]/mL (ref <=5)

## 2023-03-15 MED ORDER — iohexol (OMNIPaque) 350 mg iodine/mL solution 100 mL
350 | Freq: Once | INTRAVENOUS | Status: AC
Start: 2023-03-15 — End: 2023-03-15
  Administered 2023-03-15: 100 mL via INTRAVENOUS

## 2023-03-15 MED ORDER — ondansetron (Zofran) 4 mg tablet
4 | ORAL_TABLET | Freq: Four times a day (QID) | ORAL | 0 refills | Status: AC
Start: 2023-03-15 — End: 2023-03-18

## 2023-03-15 MED ORDER — methocarbamol (Robaxin) 750 mg tablet
750 | ORAL_TABLET | Freq: Three times a day (TID) | ORAL | 0 refills | 10.00000 days | Status: AC | PRN
Start: 2023-03-15 — End: ?

## 2023-03-15 MED ORDER — ketorolac (Toradol) injection 15 mg
15 | Freq: Once | INTRAMUSCULAR | Status: AC
Start: 2023-03-15 — End: 2023-03-15
  Administered 2023-03-15: 15 mg via INTRAVENOUS

## 2023-03-15 MED ORDER — ondansetron (Zofran) 4 mg tablet
4 | ORAL_TABLET | Freq: Four times a day (QID) | ORAL | 0 refills | Status: DC
Start: 2023-03-15 — End: 2023-03-15

## 2023-03-15 MED ORDER — sodium chloride 0.9 % flush 10 mL
INTRAMUSCULAR | Status: DC | PRN
Start: 2023-03-15 — End: 2023-03-15

## 2023-03-15 MED ORDER — methocarbamol (Robaxin) 750 mg tablet
750 | ORAL_TABLET | Freq: Three times a day (TID) | ORAL | 0 refills | 10.00000 days | Status: DC | PRN
Start: 2023-03-15 — End: 2023-03-15

## 2023-03-15 MED FILL — KETOROLAC 15 MG/ML INJECTION SOLUTION: 15 15 mg/mL | INTRAMUSCULAR | Qty: 1

## 2023-03-15 NOTE — ED Triage Notes (Signed)
 Pt a&o x3 and reports being diagnosed with kidney infection at [facility] yesterday. Pt reports lower abdominal pain, nausea, and feeling like she is "not completely emptying her bladder".

## 2023-03-15 NOTE — ED Progress Note (Signed)
 MEDICAL SCREENING EXAM - PROVIDER IN TRIAGE    Brief HPI: 23 year old female presents to the Emergency Department for evaluation of abdominal pain.  Was seen in urgent care yesterday and reportedly diagnosed with pyelonephritis, despite a normal UA.  Has taken 2 doses of cefdinir since.  Has been having lower abdominal discomfort for the last few days and feeling incomplete emptying sensation with urination.  No dysuria.  No hematuria.  No fevers or chills.  Reports nausea and vomiting.    Exam:  Constitutional: Vital signs reviewed. Well-nourished.  Respiratory: Nonlabored. Speaking in full clear sentences.   Skin: Normal color. No rash.  Neuro: Alert. Normal gross motor.  Psych. Normal affect    Tests ordered:   Orders Placed This Encounter   Procedures    CT ABDOMEN PELVIS W CONTRAST    CBC and differential    Comprehensive metabolic panel    Lipase    Urine Testing (UA, UARC, Urine Culture)    hCG, quantitative, pregnancy    CBC w/ Differential    Urinalysis reflex to culture    Insert peripheral IV    Saline lock IV       Disposition: Main ED     Observation Status: ED abdominal pain observation for labs, imaging, reevaluation    Patient was advised not to leave the emergency department until further evaluation and work-up is complete.     Gertie Fey, [place]  03/15/23 [phone]

## 2023-03-15 NOTE — Discharge Instructions (Signed)
 You were seen today for evaluation of back pain.  Your lab work today is reassuring, urine test does not show any signs of urine infection at this point, and your CT of the abdomen is negative for any acute process.  It is possible that you have a resolving urinary tract infection so you should continue the antibiotics until your urine culture has resulted.  Otherwise it is possible that your symptoms are muscular in nature for which I recommend Tylenol 1000 mg and ibuprofen 600 mg every 8 hours as needed for pain.  I prescribed muscle relaxant Robaxin for back spasm and Zofran for nausea.  Take these medications as prescribed however please be cautious Robaxin may be sedating and you should not drink, drive, or operate machinery after taking it.  Follow-up with your primary doctor.  Return if any new or worsening condition.

## 2023-03-15 NOTE — ED Provider Notes (Signed)
 Emergency Department Note  [facility]    No chief complaint on file.      HISTORY OF PRESENT ILLNESS  Patient is a 23 year old female presents the emergency department for evaluation of back pain.  Patient reports that symptoms began yesterday, she was seen in outside urgent care, and was diagnosed with pyelonephritis despite normal UA.  She was given a prescription for cefdinir which she has taken 2 doses of thus far.  Reports lower abdominal discomfort with feeling of incomplete emptying sensation with urination.  She denies dysuria, hematuria, fevers or chills.  She does report nausea and without vomiting or diarrhea.  No history of similar symptoms in the past. No alleviating measures at home.  Patient does report that she had a fall earlier this week however has had negative x-ray at outside facility.  No head strike or loss of consciousness.  No numbness or tingling of the groin.  No urinary incontinence.      History provided by:  Patient  Language interpreter used: No          PAST MEDICAL HISTORY  Patient Active Problem List   Diagnosis    Colitis       SURGICAL/FAM/SOCIAL HISTORY  No past surgical history on file.  No family history on file.  Social History     Tobacco Use    Smoking status: Never    Smokeless tobacco: Never   Vaping Use    Vaping status: Never Used   Substance Use Topics    Alcohol use: Yes     Comment: socially    Drug use: Never     Types: Other       MEDICATIONS  Prior to Admission medications    Medication Sig Start Date End Date Taking? Authorizing Provider   budesonide EC (Entocort EC) 3 mg 24 hr capsule Take 3 capsules (9 mg) by mouth in the morning for 30 days, THEN 2 capsules (6 mg) in the morning for 30 days, THEN 1 capsule (3 mg) in the morning. 02/18/23 05/19/23  Birdena Crandall, MD   drospirenone-ethinyl estradioL Dianah Field, Gianvi) 3-0.02 mg tablet Take 1 tablet by mouth once daily.    Historical Provider, MD   hydrOXYzine HCL (Atarax) 25 mg tablet Take 25 mg by mouth  if needed each day for itching.    Historical Provider, MD      Allergies   Allergen Reactions    Amoxicillin        REVIEW OF SYSTEMS  Review of Systems   Constitutional:  Negative for chills and fever.   Eyes:  Negative for visual disturbance.   Respiratory:  Negative for shortness of breath.    Cardiovascular:  Negative for chest pain.   Gastrointestinal:  Positive for abdominal pain, nausea and vomiting.   Genitourinary:  Positive for flank pain. Negative for dysuria and frequency.   Musculoskeletal:  Positive for back pain. Negative for neck pain and neck stiffness.   Skin:  Negative for rash.   Neurological:  Negative for dizziness.   Psychiatric/Behavioral:  Negative for confusion.    All other systems reviewed and are negative.      PHYSICAL EXAM  BP 111/72 (BP Location: Left arm, Patient Position: Lying)   Pulse 69   Temp 36.4 C (97.6 F) (Temporal)   Resp 18   LMP 11/22/2022   SpO2 95%   Physical Exam  Vitals reviewed.   Constitutional:       General: She is not in  acute distress.     Appearance: Normal appearance. She is not ill-appearing or toxic-appearing.   HENT:      Head: Normocephalic and atraumatic.      Nose: Nose normal.      Mouth/Throat:      Mouth: Mucous membranes are moist.   Eyes:      Conjunctiva/sclera: Conjunctivae normal.   Cardiovascular:      Rate and Rhythm: Normal rate and regular rhythm.   Pulmonary:      Effort: Pulmonary effort is normal.      Breath sounds: No wheezing, rhonchi or rales.   Abdominal:      Palpations: Abdomen is soft.      Tenderness: There is no abdominal tenderness. There is right CVA tenderness and left CVA tenderness. There is no guarding.      Comments: Difficult to discern whether true CVA tenderness versus muscular back pain bilaterally.    Musculoskeletal:         General: Normal range of motion.      Cervical back: Normal range of motion.      Comments: Reproducible muscular back pain in the lumbar paraspinal musculature.  No midline tenderness,  crepitus, deformity.   Skin:     General: Skin is warm.      Capillary Refill: Capillary refill takes less than 2 seconds.   Neurological:      General: No focal deficit present.      Mental Status: She is alert and oriented to person, place, and time.   Psychiatric:         Mood and Affect: Mood normal.         Behavior: Behavior normal.         TESTING RESULTS  Labs Reviewed   LIPASE - Normal       Result Value    Lipase 54     HCG, QUANTITATIVE, PREGNANCY - Normal    hCG, serum, quantitative <1      Narrative:     Expected Results:  Adult Female: 0-1 mIU/mL  Non-pregnant Female: 0-4 mIU/mL    If pregnancy is expected, suggest repeat in 24-72 hours.    The concentration of HCG rises rapidly during early pregnancy to a level of 5,000 to 200,000 mIU/mL at 10-12 weeks, followed by a slow decline to levels of 1,000 to 50,000 mIU/mL during the 3rd trimester.    The use of this assay to monitor or diagnose patients with cancer or any other condiion unrelated to pregnancy has not been cleared or approved by the FDA or the manufacturer of the assay.   URINALYSIS REFLEX TO CULTURE - Normal    Color, Ur Yellow      Clarity, Ur Clear      Specific Gravity, Ur <=1.005      pH, Ur 7.0      Protein,Ur Negative      Glucose,Ur Negative      Ketones, Ur Negative      Bilirubin, Ur Negative      Blood, Ur Negative      Urobilinogen, Ur 0.2      Nitrite, Ur Negative      Leukocyte Esterase, Ur Negative     CBC W/DIFF    Narrative:     The following orders were created for panel order CBC and differential.  Procedure  Abnormality         Status                     ---------                               -----------         ------                     CBC w/ Differential[[id number]]                              Final result                 Please view results for these tests on the individual orders.   COMPREHENSIVE METABOLIC PANEL    Sodium 139      Potassium 4.6      Chloride 107      CO2 (Bicarbonate) 25       Anion Gap 7      BUN 9      Creatinine 0.67      eGFRcr 126      Glucose 84      Fasting? Unknown      Calcium 9.2      AST 17      ALT 18      Alkaline phosphatase 54      Protein, total 7.0      Albumin 3.5      Bilirubin, total 0.3     URINE TESTING (UA, UARC, URINE CULTURE)    Narrative:     The following orders were created for panel order Urine Testing (UA, UARC, Urine Culture).  Procedure                               Abnormality         Status                     ---------                               -----------         ------                     Urinalysis reflex to cul.Marland KitchenMarland Kitchen[[id number]]  Normal              Final result                 Please view results for these tests on the individual orders.   CBC WITH DIFFERENTIAL    WBC 7.2      RBC 4.31      Hemoglobin 12.2      Hematocrit 36.9      MCV 85.6      MCH 28.3      MCHC 33.1      RDW-CV 13.3      RDW-SD 41.7      Platelets 288      MPV 10.0      Neutrophil % 50.9      Lymphocyte % 40.7      Monocytes % 6.7      Eosinophils % 1.0      Basophils %  0.6      Immature Granulocytes % 0.1      NRBC % 0.0      Neutrophils Absolute 3.64      Lymphocytes Absolute 2.91      Monocytes Absolute 0.48      Eosinophils Absolute 0.07      Basophils Absolute 0.04      Immature Granulocytes Absolute 0.01      NRBC Absolute 0.00       CT ABDOMEN PELVIS W CONTRAST   Final Result   No acute abnormality. The prior terminal ileal and proximal colonic inflammation has resolved.        Annabelle Harman 03/15/2023 7:54 PM          PROCEDURES  Procedures    ED COURSE/MDM  Diagnoses as of 03/15/23 2021   Acute bilateral low back pain without sciatica     ED Course & MDM   Medical Decision Making  Patient is a 23 year old female who presents to the emergency department for evaluation of back pain, urinary symptoms.  In the emergency department patient is afebrile, hemodynamically stable, neurovascularly intact, no acute distress.    Physical examination is as above, difficult to  discern whether CVA tenderness or muscular back pain.  No red flag symptoms to necessitate emergent MRI at this time.  Patient ordered for the above lab work which is unremarkable including normal CBC, CMP, negative hCG, normal lipase.  Urinalysis without any signs of infection or other abnormality.  Discussed with patient that she can continue taking antibiotic until urine culture is negative as this could be falsely negative in the setting of multiple doses of antibiotic.  CT of the abdomen and pelvis negative for any acute abnormality.  Patient feeling improved after Toradol.  Discussed with patient that the etiology of symptoms is somewhat unclear, possibly muscular back pain particularly in the setting of fall this week.  Discussed that there could be other intra-abdominal etiology of symptoms such as gastroenteritis however less likely given lack of diarrhea.  Finally discussed that patient could be experiencing infection of the urinary system that is otherwise improving on antibiotics.  Ultimately patient is requesting to be discharged at this time which I feel is reasonable.  Recommended supportive measures, Tylenol and ibuprofen, provided her with prescription for muscle relaxant and Zofran for nausea, and recommended reevaluation if any new or worsening condition otherwise follow-up with PCP.  All questions answered patient was discharged.    Problems Addressed:  Acute bilateral low back pain without sciatica: complicated acute illness or injury    Risk  Prescription drug management.         Images independently interpreted by myself with ED attending, we agree with radiologist interpretation.         PRESCRIPTIONS  Current Discharge Medication List           DIAGNOSIS  Bilateral back pain    CONDITION  Fair      DISPOSITION  At this time, I do feel that patient is stable for outpatient management with PCP and specialists. ED observation has ended. Patient informed of evaluation results.  I have answered  all questions.  Patient told to inform primary care doctor today of this ED visit and to obtain follow-up visit or return if unable to see primary care doctor.  Patient told to seek medical attention immediately for any further concerns, worsening of condition, or not getting better in the expected time thought to. Patient has been discharged.  Gerrit Halls, [place]  03/15/2023               Gerrit Halls, [place]  03/15/23 2024

## 2023-03-16 LAB — RAINBOW DRAW LT. GREEN PST TOP

## 2023-03-16 NOTE — ED Notes (Signed)
 Call made.  Phone message states that mailbox is full and is not receiving any new messages.     Dalene Carrow, RN  03/16/23 1017

## 2023-03-19 ENCOUNTER — Ambulatory Visit
Admit: 2023-03-19 | Discharge: 2023-03-19 | Payer: PRIVATE HEALTH INSURANCE | Attending: Internal Medicine | Primary: Internal Medicine

## 2023-03-19 DIAGNOSIS — K50012 Crohn's disease of small intestine with intestinal obstruction: Secondary | ICD-10-CM

## 2023-03-19 NOTE — Progress Notes (Signed)
 [facility]    GASTROENTEROLOGY PROGRESS NOTE  OUTPATIENT FOLLOW UP    HPI:  I saw Ellen Herrera today for a follow up regarding TI inflammation.  Ellen Herrera was last seen here by me on 03/06/2023.    Referring Provider:  No ref. provider found  Primary Provider:  Odis Luster, MD    This real-time, interactive virtual Telehealth encounter was done by video with the patient's verbal consent. Two patient identifiers were used and confirmed. Physical location of home to the patient was located in [place] at the time of the visit.  Physical location of the provider: office. Other participants/involvement: none    HPI:  She had c-scope done showing possible ileal Crohn's disease.  She was started on Entocort 9 mg taper shortly after that.    No longer having abdo pain.  When she was admitted in late 2025, she had RLQ pain. That has resolved.  She has some nausea (but could be from pyelo). No vomiting.  No dysphagia or odynophagia. No GERD.    She is having more normal BMs.  Having 2 BM/day, formed stool. No blood or melena.   No weight loss.  No jaundice or icterus.     No EIM of IBD.    She did have some right sided flank pain and went to ER over weekend in late Feb 2025 and was found to have UTI with pyelo.  Started on Cefdinir and now doing better.      C-scope in 02/18/2023         -  The colon (entire examined portion) appeared normal.  Biopsies            were taken with a cold forceps for histology.         - A localized area of mucosa in the terminal ileum was mildly            erythematous and congested. Possible small aphthous ulcers seen,            maybe 1 or 2 site. No stricture, mass or friability. Biopsies were            taken with a cold forceps for histology.         -  Internal hemorrhoids were found during retroflexion.  The            hemorrhoids were small.    A. Terminal Ileum; TI r/o Crohns bxs:  Focal mildly active ileitis (see comment).      Comment: Focal mildly active ileitis is seen adjacent to lymphoid aggregates with mild crypt distortion.  The crypt distortion may be related to the lymphoid aggregates.  No other definitive evidence of chronicity is seen, although multiple deeper levels have been examined.     B. Ascending colon bxs:  Colonic mucosa within normal limits.     C. Descending colon bxs:  Colonic mucosa within normal limits.     D. Rectum; rectal bxs:  Rectal mucosa within normal limits.    Past Medical History:   Diagnosis Date    Anxiety        No past surgical history on file.    Social History     Socioeconomic History    Marital status: Single     Spouse name: Not on file    Number of children: Not on file    Years of education: Not on file    Highest education level: Not on file  Occupational History    Not on file   Tobacco Use    Smoking status: Never    Smokeless tobacco: Never   Vaping Use    Vaping status: Never Used   Substance and Sexual Activity    Alcohol use: Yes     Comment: socially    Drug use: Never     Types: Other    Sexual activity: Yes   Other Topics Concern    Not on file   Social History Narrative    Not on file     Social Determinants of Health     Financial Resource Strain: Patient Declined (02/15/2023)    Overall Financial Resource Strain (CARDIA)     Difficulty of Paying Living Expenses: Patient declined   Food Insecurity: Unknown (02/15/2023)    Hunger Vital Sign     Worried About Running Out of Food in the Last Year: Patient declined     Ran Out of Food in the Last Year: Not on file   Transportation Needs: Patient Declined (02/15/2023)    PRAPARE - Therapist, art (Medical): Patient declined     Lack of Transportation (Non-Medical): Patient declined   Physical Activity: Not on file   Stress: Not on file   Social Connections: Not on file   Intimate Partner Violence: Not At Risk (03/14/2023)    Received from [employer]    Intimate Partner Violence     Are you denied basic  needs such as food, clothing, or medical care?: No     In the past 12 months have you been in a relationship with a person who hurts, threatens, or tries to control you?: No     Are you denied basic needs such as food, clothing, or medical care?: No     In the past 12 months have you been in a relationship with a person who hurts, threatens, or tries to control you?: No   Housing Stability: Unknown (02/15/2023)    Housing Stability Vital Sign     Unable to Pay for Housing in the Last Year: Not on file     Number of Times Moved in the Last Year: Not on file     Homeless in the Last Year: Patient declined       Current Outpatient Medications   Medication Sig Dispense Refill    budesonide EC (Entocort EC) 3 mg 24 hr capsule Take 3 capsules (9 mg) by mouth in the morning for 30 days, THEN 2 capsules (6 mg) in the morning for 30 days, THEN 1 capsule (3 mg) in the morning. 180 capsule 0    drospirenone-ethinyl estradioL (Yaz, Gianvi) 3-0.02 mg tablet Take 1 tablet by mouth once daily.      hydrOXYzine HCL (Atarax) 25 mg tablet Take 25 mg by mouth if needed each day for itching.      methocarbamol (Robaxin) 750 mg tablet Take 1 tablet (750 mg) by mouth if needed in the morning, at noon, and at bedtime for muscle spasms for up to 15 doses. 15 tablet 0     No current facility-administered medications for this visit.       Allergies   Allergen Reactions    Amoxicillin        Review of Systems   See HPI.  All other systems negative.    PHYSICAL EXAMINATION:  No data found.  Wt Readings from Last 3 Encounters:   02/18/23 68 kg  02/14/23 68 kg      There is no height or weight on file to calculate BMI.    GENERAL APPEARANCE: Well appearing, alert, in no acute distress, well-hydrated, well nourished.  SKIN: Skin color, texture, turgor normal, no suspicious rashes or lesions.   EYES: Anicteric sclera. Pupils are equally round and reactive to light. Extraocular movements are intact.  NECK: Supple, no adenopathy. Thyroid is  symmetric, normal size. No bruits.   LUNGS: No accessory muscle use, no cyanosis, no asymmetric calf swelling, no bony deformity.   HEART: Normal JVP, no signficant peripheral edema, no thrill.  ABDOMEN: Abdomen soft, non-tender, non-distended. No masses, ascites or hepatosplenomegaly.   EXTREMITIES: No deformities, edema, skin discoloration, clubbing or cyanosis.   NEUROLOGIC: Gait normal. Sensation and strength grossly intact.     DATA:  Diagnostic tests and/or endoscopic procedures reviewed for today's visit:  Labs, imaging, procedure reports, surgeries.    LABS:  Lab Results   Component Value Date    HCT 36.9 03/15/2023    WBC 7.2 03/15/2023     Lab Results   Component Value Date    ALKPHOS 54 03/15/2023    AST 17 03/15/2023    ALT 18 03/15/2023       Total time spent: 30 minutes.  This includes extensive chart review, history and physical exam, retrieval of outside records, personal detailed documentation, corresponding with other HCPs and counseling.     Plan   ASSESSMENT AND PLAN:  69F, previously healthy, admitted in 01/2023 for 3-day history of RLQ pain with imaging concerning for ileocolitis between the TI and proximal ascending colon and question of possible appendicitis.     -Ileocolitis/Crohn's disease  She has imaging findings of inflammation affecting the TI, cecum and proximal ascending colon.  Her current presentation in 01/2023 is more in keeping with Crohn's disease rather than acute appendicitis.  I reviewed her CT from 03/2022 that showed fecalization of the small bowel which may indicate chronic luminal narrowing in this area which would indicate a chronic inflammatory process rather than acute issues with the appendix.     As well, her ESR and CRP are elevated.  Her fecal calprotectin was low.  Her colonoscopy in 01/2023 showed evidence of possible ileal Crohn's disease.    She was started on Entocort 9 mg taper over 3 months and is improving clinically.  We will check inflammatory markers to  ensure they are improving.  Check IBD serology.     We discussed the natural history of IBD.  She had several questions about causes, complications, risk factors and treatments.  We also discussed dietary modification.  Will arrange for referral to dietitian for evaluation.    Depending on her symptoms, we may consider long-term treatment of her Crohn's with a biologic or possibly monitoring if her symptoms are mild and inflammatory markers are not elevated.    - Pyelonephritis   Had ER visit in late 02/2023 for right-sided back pain.  Found to have UTI pyelonephritis.  Treated with antibiotics and improving.    Follow up: 3 months  Thank you for involving me in the care of this patient.     Birdena Crandall, MD MPH [facility]

## 2023-04-05 ENCOUNTER — Inpatient Hospital Stay
Admit: 2023-04-05 | Discharge: 2023-04-05 | Disposition: A | Discharge status: Home / Self Care | Payer: PRIVATE HEALTH INSURANCE

## 2023-04-05 DIAGNOSIS — S61215A Laceration without foreign body of left ring finger without damage to nail, initial encounter: Secondary | ICD-10-CM

## 2023-04-05 MED ORDER — lidocaine (Xylocaine) 10 mg/mL (1 %) injection 50 mg
10 | Freq: Once | INTRAMUSCULAR | Status: AC
Start: 2023-04-05 — End: 2023-04-05
  Administered 2023-04-05: 23:00:00 50 mL

## 2023-04-05 MED ORDER — diphth,pertus(acell),tetanus (BoostRIX) vaccine 0.5 mL
2.5-8-5 | Freq: Once | INTRAMUSCULAR | Status: AC
Start: 2023-04-05 — End: 2023-04-05
  Administered 2023-04-05: 23:00:00 0.5 mL via INTRAMUSCULAR

## 2023-04-05 MED FILL — LIDOCAINE HCL 10 MG/ML (1 %) INJECTION SOLUTION: 10 10 mg/mL (1 %) | INTRAMUSCULAR | Qty: 10

## 2023-04-05 MED FILL — DIPHTH,PERTUSSIS(ACELL),TETANUS 2.5 LF UNIT-8 MCG-5 FL/0.5 ML IM WRAPPER: 2.5-8-5 2.5-8-5 Lf-mcg-Lf/0.5mL | INTRAMUSCULAR | Qty: 0.5

## 2023-04-05 NOTE — Discharge Instructions (Addendum)
 You were seen in the emergency department today for a laceration to your left ring finger.  Your laceration was repaired with 4 stitches.  You tolerated the procedure well.  You do not require further care in the emergency department.  You were given a tetanus booster in the ED.  You have been advised to keep the area dry for 24 hours and then wash with soap and water.  Please return to the ED or [facility] urgent care in 7 to 10 days for suture removal.  Please return to the ED with any worsening symptoms and follow-up with your PCP.

## 2023-04-05 NOTE — ED Progress Note (Signed)
 MEDICAL SCREENING EXAM - PROVIDER IN TRIAGE    Brief HPI: Patient is a 23 year old female presenting to the ED with a left finger laceration.  Patient states she was cutting food with a knife about 20 minutes ago when she cut her left ring finger.  Finger is currently bleeding, controlled with pressure and gauze.  Patient denies loss of consciousness.  Denies numbness or tingling, or weakness.  Last tetanus 2013.    Exam:  Constitutional: Vital signs reviewed. Well-nourished.  Respiratory: Nonlabored. Speaking in full clear sentences.   Skin: Normal color. No rash.  Neuro: Alert. Normal gross motor.  Psych. Normal affect    Tests ordered:   No orders of the defined types were placed in this encounter.      Disposition: Main ED    Observation Status: Patient will be brought into observation pending further assessment at 1822.    Patient was advised not to leave the emergency department until further evaluation and work-up is complete.     [place], [place]  04/05/23 [phone]

## 2023-04-05 NOTE — ED Triage Notes (Addendum)
 Patient reports cutting food with a knife, the knife accidentally slipping out of her hand and cut distal aspect of left ring finger. Small amount of blood coming from finger, given gauze in triage. LAC to finger. Denies numbness/tingling. Unknown last tetanus.

## 2023-04-05 NOTE — ED Provider Notes (Signed)
 [facility] EMERGENCY DEPARTMENT  [address]  [place] [address]    Chief Complaint   Patient presents with    Finger Laceration       HISTORY OF PRESENT ILLNESS  Patient is a 23 year old female presenting to the ED with a left finger laceration.  Patient states she was cutting food with a knife about 20 minutes ago when she cut her left ring finger.  Finger is currently bleeding, controlled with pressure and gauze.  Patient denies loss of consciousness.  Denies numbness or tingling, or weakness.  Last tetanus 2013.      History provided by:  Patient  Language interpreter used: No      PAST MEDICAL HISTORY  Patient Active Problem List   Diagnosis    Colitis     Past Medical History:   Diagnosis Date    Anxiety      SURGICAL/FAM/SOCIAL HISTORY  History reviewed. No pertinent surgical history.    No family history on file.    Social History     Tobacco Use    Smoking status: Never    Smokeless tobacco: Never   Vaping Use    Vaping status: Never Used   Substance Use Topics    Alcohol use: Yes     Comment: socially    Drug use: Never     Types: Other       MEDICATIONS  Prior to Admission medications    Medication Sig Start Date End Date Taking? Authorizing Provider   budesonide EC (Entocort EC) 3 mg 24 hr capsule Take 3 capsules (9 mg) by mouth in the morning for 30 days, THEN 2 capsules (6 mg) in the morning for 30 days, THEN 1 capsule (3 mg) in the morning. 02/18/23 05/19/23  Birdena Crandall, MD   drospirenone-ethinyl estradioL Dianah Field, Gianvi) 3-0.02 mg tablet Take 1 tablet by mouth once daily.    Historical Provider, MD   hydrOXYzine HCL (Atarax) 25 mg tablet Take 25 mg by mouth if needed each day for itching.    Historical Provider, MD   methocarbamol (Robaxin) 750 mg tablet Take 1 tablet (750 mg) by mouth if needed in the morning, at noon, and at bedtime for muscle spasms for up to 15 doses. 03/15/23   Gerrit Halls, PA      Allergies   Allergen Reactions    Amoxicillin      REVIEW OF SYSTEMS  Review  of Systems   Constitutional:  Negative for chills and fever.   HENT:  Negative for ear pain and sore throat.    Eyes:  Negative for pain and visual disturbance.   Respiratory:  Negative for cough and shortness of breath.    Cardiovascular:  Negative for chest pain and palpitations.   Gastrointestinal:  Negative for abdominal pain and vomiting.   Genitourinary:  Negative for dysuria and hematuria.   Musculoskeletal:  Negative for arthralgias and back pain.   Skin:  Positive for wound. Negative for color change and rash.   Neurological:  Negative for seizures and syncope.   All other systems reviewed and are negative.    PHYSICAL EXAM  ED Triage Vitals [04/05/23 1808]   Temp Pulse Resp BP   37 C (98.6 F) 84 16 117/78      SpO2 Temp Source Heart Rate Source Patient Position   98 % Temporal -- --      BP Location FiO2 (%)     -- --       Physical  Exam  Vitals and nursing note reviewed.   Constitutional:       General: She is not in acute distress.     Appearance: Normal appearance.   HENT:      Head: Normocephalic and atraumatic.      Nose: Nose normal.   Eyes:      Conjunctiva/sclera: Conjunctivae normal.   Pulmonary:      Effort: Pulmonary effort is normal.   Musculoskeletal:      Cervical back: Normal range of motion.   Skin:     Comments: 3 cm linear laceration across the palmar aspect of the left ring finger, distal to the DIP joint.  Sensation intact throughout all fingers. Capillary refill less than 2 seconds throughout all fingers.  Radial pulse palpable.   Neurological:      General: No focal deficit present.      Mental Status: She is alert.   Psychiatric:         Mood and Affect: Mood normal.       TESTING RESULTS  PROCEDURES  Labs Reviewed - No data to display  No orders to display     Lac. Repair    Performed by: Burman Riis, PA  Authorized by: Burman Riis, PA    Consent:     Consent obtained:  Verbal    Consent given by:  Patient    Risks, benefits, and alternatives were discussed: yes       Risks discussed:  Infection, pain, retained foreign body, need for additional repair, poor cosmetic result, tendon damage, nerve damage, poor wound healing and vascular damage    Alternatives discussed:  No treatment, delayed treatment, observation and referral  Universal protocol:     Procedure explained and questions answered to patient or proxy's satisfaction: yes      Relevant documents present and verified: yes      Test results available: no      Imaging studies available: no      Required blood products, implants, devices, and special equipment available: no      Site/side marked: yes      Immediately prior to procedure, a time out was called: yes      Patient identity confirmed:  Verbally with patient  Anesthesia:     Anesthesia method:  Local infiltration    Local anesthetic:  Lidocaine 1% w/o epi  Laceration details:     Location:  Finger    Finger location:  L ring finger    Length (cm):  3    Depth (mm):  1  Pre-procedure details:     Preparation:  Patient was prepped and draped in usual sterile fashion  Exploration:     Limited defect created (wound extended): no      Imaging outcome: foreign body not noted      Wound exploration: wound explored through full range of motion and entire depth of wound visualized      Contaminated: no    Treatment:     Area cleansed with:  Saline and povidone-iodine    Amount of cleaning:  Standard    Irrigation solution:  Sterile saline    Irrigation method:  Tap  Skin repair:     Repair method:  Sutures    Suture size:  4-0    Suture material:  Nylon    Suture technique:  Simple interrupted    Number of sutures:  4  Approximation:     Approximation:  Close  Repair type:  Repair type:  Simple  Post-procedure details:     Dressing:  Adhesive bandage and antibiotic ointment    Procedure completion:  Tolerated well, no immediate complications    ED COURSE/MDM  Diagnoses as of 04/05/23 1923   Laceration of left ring finger without foreign body without damage to nail,  initial encounter     ED Course & MDM   Medical Decision Making  Patient is a 23 year old female presenting to the ED with a left finger laceration.  Patient is afebrile, nontoxic-appearing, nonacute distress, vital signs are within normal limits.  Physical exam remarkable for 3 cm linear laceration across the tip of the left ring finger.    Tetanus was updated in the emergency department.  Ring finger laceration was repaired with 4 sutures, patient tolerated the procedure well, see procedure note above.  Patient was advised to keep the area dry for 24 hours and then wash with soap and water.  She was advised to return to the ED or [facility] urgent care in 7 to 10 days for suture removal.  She was advised to return to the ED with any worsening symptoms and follow-up with her PCP.  Patient does not require further care in the emergency department and can be discharged.  All questions answered.    Problems Addressed:  Laceration of left ring finger without foreign body without damage to nail, initial encounter: complicated acute illness or injury    Risk  Prescription drug management.      DIAGNOSIS  Problem List Items Addressed This Visit    None  Visit Diagnoses       Laceration of left ring finger without foreign body without damage to nail, initial encounter    -  Primary          CONDITION  Fair    DISPOSITION  Discharge    Patient informed of evaluation results. Hospitalization is not necessary as patient is safe for discharge and appropriate for outpatient management. I have answered all questions.  Patient told to inform primary care doctor today of this ED visit and to obtain follow-up visit or return if unable to see primary care doctor.  Patient told to seek medical attention immediately for any further concerns, worsening of condition, or not getting better in the expected time thought to. Patient has been discharged.           [employer] [employer], [place]  04/05/23 [phone]

## 2023-04-06 NOTE — ED Notes (Signed)
 Call made.  Phone message states that mailbox is full and is not receiving any new messages.     Dalene Carrow, RN  04/06/23 [phone]

## 2023-04-12 ENCOUNTER — Inpatient Hospital Stay: Payer: PRIVATE HEALTH INSURANCE | Primary: Internal Medicine

## 2023-07-07 ENCOUNTER — Ambulatory Visit: Payer: PRIVATE HEALTH INSURANCE | Attending: Internal Medicine

## 2023-11-10 ENCOUNTER — Ambulatory Visit: Payer: PRIVATE HEALTH INSURANCE | Attending: Internal Medicine

## 2023-11-20 ENCOUNTER — Ambulatory Visit: Admit: 2023-11-20 | Payer: PRIVATE HEALTH INSURANCE

## 2023-11-20 MED ORDER — APRI 0.15 MG-0.03 MG TABLET
0.15-0.03 | ORAL_TABLET | Freq: Every day | ORAL | 0 refills | 84.00000 days | Status: AC
Start: 2023-11-20 — End: ?

## 2023-11-20 NOTE — Patient Instructions (Signed)
 You will receive a portal message with your results within a week of testing.   If you do not hear back from me within that time frame, please portal/call the office to let me know.

## 2023-11-20 NOTE — Progress Notes (Signed)
 [facility]  [address]  [address] and [address]  [place] [address]  Dept: [phone]  Dept Fax: [phone]     Patient ID: Ellen Herrera is a 23 y.o. female who presents for Annual Exam.    Subjective  Pt here for checkup  Under psyche care for Ocd/anxiety/depression    Interested in starting ovcps  No personal/family h/o of blood clots  Nonsmoker  No migraines    Sees gI for h/o crohns asx now      Problem List[1]  Current Outpatient Medications   Medication Instructions    desogestreL-ethinyl estradioL  (Apri) 0.15-0.03 mg tablet 1 tablet, oral, Daily    fluoxetine HCl (PROZAC ORAL) oral    fLuvoxaMINE (LUVOX) 200 mg, Nightly     Allergies[2]  Medical History[3]  Surgical History[4]  Family History[5]  Social History[6]    Ros: no weight loss, no fatigue, no HA, no CP, no SOB, no vision changes, no trouble hearing, no cold/allergy sx, moves bowels normally, no blood in stool,  no n/v,d/c, no urinary complaints, mood good, no MS aches, Objective  Visit Vitals  BP 100/68 (BP Location: Left arm, Patient Position: Sitting, BP Cuff Size: Adult)   Pulse 91   Temp 36.8 C (98.3 F) (Oral)   Ht 1.74 m   Wt 73.3 kg   LMP  (LMP Unknown)   SpO2 98%   BMI 24.21 kg/m   OB Status Having periods   BSA 1.88 m     Physical Exam  Constitutional:       General: She is not in acute distress.     Appearance: Normal appearance. She is normal weight.   HENT:      Head: Normocephalic and atraumatic.      Right Ear: Tympanic membrane, ear canal and external ear normal.      Left Ear: Tympanic membrane, ear canal and external ear normal.      Nose: Nose normal. No rhinorrhea.      Mouth/Throat:      Mouth: Mucous membranes are moist.      Pharynx: Oropharynx is clear. No oropharyngeal exudate or posterior oropharyngeal erythema.   Eyes:      Extraocular Movements: Extraocular movements intact.      Conjunctiva/sclera: Conjunctivae normal.      Pupils: Pupils are equal,  round, and reactive to light.   Cardiovascular:      Rate and Rhythm: Normal rate and regular rhythm.      Pulses: Normal pulses.      Heart sounds: Normal heart sounds. No murmur heard.  Pulmonary:      Effort: Pulmonary effort is normal. No respiratory distress.      Breath sounds: Normal breath sounds. No wheezing, rhonchi or rales.   Chest:      Chest wall: No tenderness.   Breasts:     Tanner Score is 5.      Breasts are symmetrical.      Right: Normal. No mass, nipple discharge or skin change.      Left: Normal. No mass, nipple discharge or skin change.   Abdominal:      General: Abdomen is flat. Bowel sounds are normal. There is no distension.      Palpations: Abdomen is soft. There is no mass.      Tenderness: There is no abdominal tenderness. There is no guarding or rebound.   Genitourinary:     General: Normal vulva.  Exam position: Knee-chest position.      Pubic Area: No rash.       Tanner stage (genital): 5.      Labia:         Right: No rash or lesion.         Left: No rash or lesion.       Urethra: No prolapse or urethral lesion.      Vagina: Normal. No vaginal discharge, bleeding, lesions or prolapsed vaginal walls.      Cervix: Normal. No cervical motion tenderness, discharge or lesion.      Uterus: Normal. Not enlarged and not tender.       Adnexa: Right adnexa normal and left adnexa normal.        Right: No mass, tenderness or fullness.          Left: No mass, tenderness or fullness.     Musculoskeletal:         General: No swelling, tenderness or deformity. Normal range of motion.      Cervical back: Normal range of motion and neck supple. No rigidity or tenderness.      Right lower leg: No edema.      Left lower leg: No edema.   Lymphadenopathy:      Cervical: No cervical adenopathy.      Upper Body:      Right upper body: No supraclavicular, axillary or pectoral adenopathy.      Left upper body: No supraclavicular, axillary or pectoral adenopathy.      Lower Body: No right inguinal  adenopathy. No left inguinal adenopathy.   Skin:     General: Skin is warm.      Capillary Refill: Capillary refill takes less than 2 seconds.      Coloration: Skin is not jaundiced.      Findings: No erythema, lesion or rash.   Neurological:      General: No focal deficit present.      Mental Status: She is alert and oriented to person, place, and time.      Cranial Nerves: No cranial nerve deficit.      Sensory: No sensory deficit.      Motor: No weakness.      Coordination: Coordination normal.      Gait: Gait normal.      Deep Tendon Reflexes: Reflexes normal.   Psychiatric:         Mood and Affect: Mood normal.         Behavior: Behavior normal.         Thought Content: Thought content normal.         Assessment / Plan  Ellen Herrera was seen today for annual exam.  Annual physical exam  Assessment & Plan:  ap, g/c done    Encouraged healthy diet and exercise, safe sex, seatbelts, helmets  Advised 1200 mg of calcium a day- from diet preferably, or from OTC supplement and  Vit D 800 units (either from fortified milk, OJ, foods or from OTC supplement)  Weight bearing exercises encouraged  Encouraged mvi if not getting 3-5 servings of fruits and veggies a day    Checkup 1 year            TDAP: 04/05/23  Encouraged covid shots  Flu shot given    Options for birth control discussed  Pt interested in ocps  Apri started  How to take this medication, side effects, cautions/concerns, monitoring, dosing reviewed with patient- if questions/concerns arise, alert MD  F/up 3 months  Pt will follow up sooner if clinical concern    Orders:  -     GC/CT (Amp DNA); Future  Encounter for initial prescription of contraceptives, unspecified contraceptive  Assessment & Plan:  ap, g/c done    Encouraged healthy diet and exercise, safe sex, seatbelts, helmets  Advised 1200 mg of calcium a day- from diet preferably, or from OTC supplement and  Vit D 800 units (either from fortified milk, OJ, foods or from OTC supplement)  Weight bearing  exercises encouraged  Encouraged mvi if not getting 3-5 servings of fruits and veggies a day    Checkup 1 year            TDAP: 04/05/23  Encouraged covid shots  Flu shot given    Options for birth control discussed  Pt interested in ocps  Apri started  How to take this medication, side effects, cautions/concerns, monitoring, dosing reviewed with patient- if questions/concerns arise, alert MD    F/up 3 months  Pt will follow up sooner if clinical concern    Orders:  -     desogestreL-ethinyl estradioL  (Apri) 0.15-0.03 mg tablet; Take 1 tablet by mouth once daily.  Screening for cervical cancer  -     Pap Smear with Reflex to Aptima HPV; Future  Need for immunization against influenza  -     Flu vaccine greater than or equal to 6 mon old, preservative free IM  Severe major depressive disorder  Assessment & Plan:  Scales reviewed  Pt assures me will be following up with pscyeh  Cont current meds    Alert MD if frequency or severity of sx increase prior to next appt    Other orders  -     PDF Report:      Patient Health Questionnaire-9 Score: 13   Interpretation: Positive screening.     Follow-up & Interventions:   - Referral to behavioral health resource.      Patient Health Questionnaire-9 Score: 13   Interpretation: Positive screening.     Follow-up & Interventions:   - Referral to behavioral health resource.         [1]   Patient Active Problem List  Diagnosis    Crohn's colitis    Annual physical exam    Severe major depressive disorder   [2]   Allergies  Allergen Reactions    Amoxicillin     Radish Rash   [3]   Past Medical History:  Diagnosis Date    Anxiety     Depression    [4] No past surgical history on file.  [5]   Family History  Problem Relation Name Age of Onset    Depression Mother estranged     Depression Father Asjia Berrios III     Diabetes Maternal Grandmother      Heart disease Paternal Grandfather Kaelani Kendrick     Breast cancer Neg Hx     [6]   Social History  Socioeconomic History     Marital status: Single     Spouse name: None    Number of children: None    Years of education: None    Highest education level: None   Occupational History    None   Tobacco Use    Smoking status: Never    Smokeless tobacco: Never   Vaping Use    Vaping status: Never Used   Substance and Sexual Activity    Alcohol use: Yes  Alcohol/week: 2.0 standard drinks of alcohol     Types: 2 Glasses of wine per week     Comment: 2 glasses of wine a week unless going out with friends    Drug use: Yes     Frequency: 4.0 times per week     Types: Marijuana, Other     Comment: CBD to help me sleep, at night    Sexual activity: Yes     Partners: Male     Birth control/protection: Other     Comment: Pill - Yaz   Other Topics Concern    None   Social History Narrative    None     Social Determinants of Health     Financial Resource Strain: Low Risk (11/21/2023)    Overall Financial Resource Strain (CARDIA)     Difficulty of Paying Living Expenses: Not hard at all   Food Insecurity: Unknown (11/21/2023)    Hunger Vital Sign     Worried About Running Out of Food in the Last Year: Never true     Ran Out of Food in the Last Year: Not on file   Transportation Needs: No Transportation Needs (11/21/2023)    PRAPARE - Therapist, Art (Medical): No     Lack of Transportation (Non-Medical): No   Physical Activity: Not on file   Stress: Not on file   Social Connections: Not on file   Intimate Partner Violence: Not At Risk (11/27/2023)    Received from [facility]    Intimate Partner Violence     Are you denied basic needs such as food, clothing, or medical care?: No     In the past 12 months have you been in a relationship with a person who hurts, threatens, or tries to control you?: No     Are you denied basic needs such as food, clothing, or medical care?: No     In the past 12 months have you been in a relationship with a person who hurts, threatens, or tries to control you?: No   Housing Stability:  Unknown (11/21/2023)    Housing Stability Vital Sign     Unable to Pay for Housing in the Last Year: Not on file     Number of Times Moved in the Last Year: Not on file     Homeless in the Last Year: No

## 2023-11-26 ENCOUNTER — Inpatient Hospital Stay
Admit: 2023-11-26 | Discharge: 2023-11-26 | Disposition: A | Discharge status: Home / Self Care | Payer: PRIVATE HEALTH INSURANCE | Arrived: AM | Attending: Emergency Medicine

## 2023-11-26 DIAGNOSIS — R42 Dizziness and giddiness: Secondary | ICD-10-CM

## 2023-11-26 LAB — TSH WITH REFLEX: TSH: 1.31 u[IU]/mL (ref 0.27–4.94)

## 2023-11-26 LAB — CBC WITH DIFFERENTIAL
Basophils %: 0.5 %
Basophils Absolute: 0.04 K/uL (ref 0.00–0.22)
Eosinophils %: 0.8 %
Eosinophils Absolute: 0.06 K/uL (ref 0.00–0.50)
Hematocrit: 36 % (ref 32.0–47.0)
Hemoglobin: 11.9 g/dL (ref 11.0–16.0)
Immature Granulocytes %: 0.3 %
Immature Granulocytes Absolute: 0.02 K/uL (ref 0.00–0.10)
Lymphocyte %: 33 %
Lymphocytes Absolute: 2.53 K/uL (ref 0.70–4.00)
MCH: 29.1 pg (ref 26.0–34.0)
MCHC: 33.1 g/dL (ref 31.0–37.0)
MCV: 88 fL (ref 80.0–100.0)
MPV: 9.8 fL (ref 9.1–12.4)
Monocytes %: 5.1 %
Monocytes Absolute: 0.39 K/uL (ref 0.36–0.77)
NRBC %: 0 % (ref 0.0–0.0)
NRBC Absolute: 0 K/uL (ref 0.00–2.00)
Neutrophil %: 60.3 %
Neutrophils Absolute: 4.62 K/uL (ref 1.50–7.95)
Platelets: 271 K/uL (ref 150–400)
RBC: 4.09 M/uL (ref 3.70–5.20)
RDW-CV: 13.2 % (ref 11.5–14.5)
RDW-SD: 42.8 fL (ref 35.0–51.0)
WBC: 7.7 K/uL (ref 4.0–11.0)

## 2023-11-26 LAB — COMPREHENSIVE METABOLIC PANEL
ALT: 13 U/L (ref 0–55)
AST: 22 U/L (ref 6–42)
Albumin: 3.8 g/dL (ref 3.2–5.0)
Alkaline phosphatase: 40 U/L (ref 30–130)
Anion Gap: 9 mmol/L (ref 3–14)
BUN: 9 mg/dL (ref 6–24)
Bilirubin, total: 0.3 mg/dL (ref 0.2–1.2)
CO2 (Bicarbonate): 23 mmol/L (ref 20–32)
Calcium: 8.6 mg/dL (ref 8.5–10.5)
Chloride: 106 mmol/L (ref 98–110)
Creatinine: 0.71 mg/dL (ref 0.55–1.30)
Glucose: 97 mg/dL (ref 70–139)
Potassium: 4.4 mmol/L (ref 3.6–5.2)
Protein, total: 6.4 g/dL (ref 6.0–8.4)
Sodium: 138 mmol/L (ref 135–146)
eGFRcr: 122 mL/min/1.73m*2 (ref >=60)

## 2023-11-26 LAB — PAP WITH REFLEX TO APTIMA HPV

## 2023-11-26 LAB — HCG, QUANTITATIVE, PREGNANCY: hCG, serum, quantitative: <1 m[IU]/mL (ref <=5)

## 2023-11-26 LAB — MAGNESIUM: Magnesium: 2.1 mg/dL (ref 1.6–2.6)

## 2023-11-26 MED ORDER — SODIUM CHLORIDE 0.9 % IV BOLUS
0.9 | Freq: Once | INTRAVENOUS | Status: AC
Start: 2023-11-26 — End: 2023-11-26
  Administered 2023-11-26: 17:00:00 1000 mL via INTRAVENOUS

## 2023-11-26 NOTE — ED Triage Notes (Signed)
 Pt arrives to ED via EMS coming from work for cc dizziness, blurred vision, and new onset short term memory loss since this AM. Unknown LKW. Coworker called EMS. Pt forgot she was in the ambulance. AOx4. +PERLL. H/o anxiety but states this is different. Denies drug/alcohol use.

## 2023-11-26 NOTE — ED Notes (Signed)
 See triage note. Pt states dizziness has improved since earlier. Pt is ambulatory with steady gait to restroom. Pt is calm and cooperative, alert and oriented. Vital signs stable. Pt denies any needs or complaints at this time. Pt does not appear in acute distress. Will continue to monitor.     Rocky Kays, RN  11/26/23 1125

## 2023-11-26 NOTE — ED APP/Resident Note (Signed)
 Patient: Ellen Herrera   MRN: [id number]   Date of service: 11/26/2023   PCP: Harlene [name], MD     EMERGENCY DEPARTMENT ENCOUNTER    CHIEF COMPLAINT       Chief Complaint   Patient presents with    Memory Loss    Dizziness       HPI     Mollie Rossano is a 23 y.o. female with past medical history of anxiety ,depression, crohn's colitis in remission who presents to the ED for evaluation of multiple symptoms. The patient reports that she has had intermittent episodes of blurry vision over the past 1 week. Additionally, she has noticed increased bruising of her lower extremities and has had increasingly itchy skin over the past several days. Today, the patient woke up feeling quite fatigued and went to work. She spoke with her doctor due to increased itchiness and fatigue and was told to make an appointment. While she was on her way to tell a coworker about her plan to see the doctor, around 8:30 AM, she describes having an episode of dizziness which felt like the room spinning and confusion, where she describes feeling strange in my head. She denies issues understanding words or speaking words, but does report that during conversation, she often forgot what they were talking about. She also reports that on the ambulance ride to the hospital, she forgot why she was in the ambulance. She reports that now, she is feeling improved and is able to recall the events of earlier today. She does report mild occipital headaches over the past 1 week, different from her typical headaches. She denies head injury or fall, fever, nausea/vomiting, chest pain, shortness of breath, abdominal pain, urinary symptoms. She does note that her fluvoxamine dose was doubled about 1 to 2 weeks ago.     REVIEW OF SYSTEMS       As per HPI.     PAST MEDICAL HISTORY       Medical History[1]    CURRENT MEDICATIONS       Previous Medications    DESOGESTREL-ETHINYL ESTRADIOL  (APRI) 0.15-0.03 MG TABLET    Take 1 tablet by mouth once daily.     FLUOXETINE HCL (PROZAC ORAL)    Take by mouth.    FLUVOXAMINE (LUVOX) 100 MG TABLET    Take 200 mg by mouth at bedtime.         FAMILY HISTORY       Family History[2]    SURGICAL HISTORY       Surgical History[3]    ALLERGIES       Allergies[4]    SOCIAL HISTORY        Social History     Socioeconomic History    Marital status: Single     Spouse name: Not on file    Number of children: Not on file    Years of education: Not on file    Highest education level: Not on file   Occupational History    Not on file   Tobacco Use    Smoking status: Never    Smokeless tobacco: Never   Vaping Use    Vaping status: Never Used   Substance and Sexual Activity    Alcohol use: Yes     Alcohol/week: 2.0 standard drinks of alcohol     Types: 2 Glasses of wine per week     Comment: 2 glasses of wine a week unless going out with friends    Drug  use: Yes     Frequency: 4.0 times per week     Types: Marijuana, Other     Comment: CBD to help me sleep, at night    Sexual activity: Yes     Partners: Male     Birth control/protection: Other     Comment: Pill - Yaz   Other Topics Concern    Not on file   Social History Narrative    Not on file     Social Determinants of Health     Financial Resource Strain: Low Risk (11/21/2023)    Overall Financial Resource Strain (CARDIA)     Difficulty of Paying Living Expenses: Not hard at all   Food Insecurity: Unknown (11/21/2023)    Hunger Vital Sign     Worried About Running Out of Food in the Last Year: Never true     Ran Out of Food in the Last Year: Not on file   Transportation Needs: No Transportation Needs (11/21/2023)    PRAPARE - Therapist, Art (Medical): No     Lack of Transportation (Non-Medical): No   Physical Activity: Not on file   Stress: Not on file   Social Connections: Not on file   Intimate Partner Violence: Not At Risk (03/14/2023)    Received from [facility]    Intimate Partner Violence     Are you denied basic needs such as food, clothing, or  medical care?: No     In the past 12 months have you been in a relationship with a person who hurts, threatens, or tries to control you?: No     Are you denied basic needs such as food, clothing, or medical care?: No     In the past 12 months have you been in a relationship with a person who hurts, threatens, or tries to control you?: No   Housing Stability: Unknown (11/21/2023)    Housing Stability Vital Sign     Unable to Pay for Housing in the Last Year: Not on file     Number of Times Moved in the Last Year: Not on file     Homeless in the Last Year: No       PHYSICAL EXAM        VITAL SIGNS:    ED Triage Vitals [11/26/23 0940]   Temp Pulse Resp BP   36.1 C (97 F) 85 16 122/83      SpO2 Temp Source Heart Rate Source Patient Position   100 % Temporal Monitor Sitting      BP Location FiO2 (%)     -- --          General: The patient appears awake, alert, non-toxic, and in no acute distress.  Head: Normocephalic, atraumatic.   Eyes: PERRL. EOMI.   ENT: Moist mucus membranes.   Neck: Supple, trachea midline. No cervical spine tenderness.   Respiratory: Even, unlabored respirations. Lungs CTAB.   CVS: Regular rate, Regular rhythm, +S1/S2, no murmur. 2+ radial pulses. No lower extremity edema. No calf tenderness.   GI: Soft, non-tender, non-distended.   Back: Spine midline and non-tender. Atraumatic.  Extremities: Moving all extremities equally. Scattered bruises in various stages of healing over bilateral lower extremities.   Neuro: HRD84, AOx4, mentation is appropriate for age, no facial droop, speech is fluid, moving all extremities with equal strength, no pronator drift, sensation grossly intact, no dysmetria, RAM intact, no clonus.   Skin: Warm, dry and intact.  Normal capillary refill.     RESULTS        LABS  Labs Reviewed   HCG, QUANTITATIVE, PREGNANCY - Normal       Result Value    hCG, serum, quantitative <1      Narrative:     Expected Results:  Adult Female: 0-1 mIU/mL  Non-pregnant Female: 0-4 mIU/mL    If  pregnancy is expected, suggest repeat in 24-72 hours.    The concentration of HCG rises rapidly during early pregnancy to a level of 5,000 to 200,000 mIU/mL at 10-12 weeks, followed by a slow decline to levels of 1,000 to 50,000 mIU/mL during the 3rd trimester.    The use of this assay to monitor or diagnose patients with cancer or any other condiion unrelated to pregnancy has not been cleared or approved by the FDA or the manufacturer of the assay.   TSH WITH REFLEX - Normal    TSH 1.31     MAGNESIUM - Normal    Magnesium 2.1     CBC W/DIFF    Narrative:     The following orders were created for panel order CBC and differential.  Procedure                               Abnormality         Status                     ---------                               -----------         ------                     CBC w/ Differential[[id number]]                              Final result                 Please view results for these tests on the individual orders.   COMPREHENSIVE METABOLIC PANEL    Sodium 138      Potassium 4.4      Chloride 106      CO2 (Bicarbonate) 23      Anion Gap 9      BUN 9      Creatinine 0.71      eGFRcr 122      Glucose 97      Fasting? No      Calcium 8.6      AST 22      ALT 13      Alkaline phosphatase 40      Protein, total 6.4      Albumin 3.8      Bilirubin, total 0.3     CBC WITH DIFFERENTIAL    WBC 7.7      RBC 4.09      Hemoglobin 11.9      Hematocrit 36.0      MCV 88.0      MCH 29.1      MCHC 33.1      RDW-CV 13.2      RDW-SD 42.8      Platelets 271      MPV 9.8      Neutrophil %  60.3      Lymphocyte % 33.0      Monocytes % 5.1      Eosinophils % 0.8      Basophils % 0.5      Immature Granulocytes % 0.3      NRBC % 0.0      Neutrophils Absolute 4.62      Lymphocytes Absolute 2.53      Monocytes Absolute 0.39      Eosinophils Absolute 0.06      Basophils Absolute 0.04      Immature Granulocytes Absolute 0.02      NRBC Absolute 0.00       ED COURSE & MEDICAL DECISION MAKING        Pertinent Lab  studies reviewed. Patient's medical record reviewed.     This patient is a 23 y.o. female with PMH anxiety ,depression, crohn's colitis in remission who presents to the ED for evaluation of multiple symptoms including itchiness and fatigue. Exam is reassuring with neurologically intact. There is some bruising of the lower extremities. Differential includes thyroid dysfunction, anemia, thrombocytopenia, metabolic derangement, dehydration, orthostasis, stress reaction. No concern for intracranial abnormality.     ED Course as of 11/29/23 1249   Wed Nov 26, 2023   1222 Comprehensive metabolic panel  WNL   1222 TSH: 1.31   1222 hCG, serum, quantitative: <1   1222 ECG 12 lead  Sinus rhythm, rate 84, QTc 450         Diagnoses as of 11/29/23 1249   Dizziness   Other fatigue     EKG is nonischemic. Labs all grossly WNL. The patient was treated with IV crystalloid with improvement. She was recommended to follow up with her PCP and given clear ED return precautions upon her discharge.     CLINICAL IMPRESSION  Dizziness  Other fatigue    Final Disposition:   Discharge  _______________________________________________________________________________________________    Harlene Cleveland, PA          [1]   Past Medical History:  Diagnosis Date    Anxiety     Depression    [2]   Family History  Problem Relation Name Age of Onset    Depression Mother estranged     Depression Father Kameisha Malicki III     Diabetes Maternal Grandmother      Heart disease Paternal Grandfather Shakenna Herrero     Breast cancer Neg Hx     [3] History reviewed. No pertinent surgical history.  [4]   Allergies  Allergen Reactions    Amoxicillin     Radish Rash        Harlene Cleveland, [place]  11/29/23 1253

## 2023-11-26 NOTE — Discharge Instructions (Addendum)
 You were seen today in the emergency department. You were evaluated with labs and EKG which were all normal and reassuring. You were treated with IV fluids with improvement.    We recommend that you follow-up with your PCP, stay well-hydrated, and focus on getting at least 8 hours of sleep at night.    Return to the emergency department at anytime for loss of consciousness, severe headache, intractable vomiting, unable to eat or drink, chest pain, working hard to breathe, confusion, or any other concerning issue.

## 2023-11-26 NOTE — ED Provider Notes (Signed)
 EMERGENCY DEPARTMENT ENCOUNTER    ATTENDING NOTE    Date of service: 11/26/2023  9:39 AM    Chief Complaint   Patient presents with    Memory Loss    Dizziness       Nursing notes reviewed and agreed with.    Electronic Medical Records Reviewed: Yes  Medical Interpreter Used: No  History Reviewed and Discussed with PA    HISTORY OF PRESENT ILLNESS:    23 year old female with a PMHx of anxiety, depression, Crohn's now in remission presents for evaluation of multiple symptoms. Patient works as a psychologist, forensic and her symptoms began 1-1.5 month ago with increasing fatigue. She began noticing her appetite has decreased 3 weeks ago, and she initially attributed her symptoms to stress. 1 week ago she began experiencing intermittent blurry vision, itchiness all over her body which she reports feels similar to an allergic reaction, atraumatic bruising to her bilateral legs, and mild occipital headaches. This morning she woke up feeling markedly fatigued and went to work. She called her doctor from work and made an appointment for this afternoon. Patient went to go tell her coworker she would need to leave work early today, when she suddenly developed room spinning dizziness, confusion, and generally feeling out of it. She reports forgetting where she was going or who she was going to talk to, but still recalls feeling able to understand what people were saying to her and respond. Coworkers called EMS for her, and in the ambulance, she felt like she forgot why she was in an ambulance. On exam, patient continues to endorse a 3/10 headache but otherwise feels improved. Denies fever, head trauma, vomiting, abdominal pain, or back pain. She notes an increase in her fluvoxamine from 100 to 200 two weeks ago. She drinks alcohol socially and uses marijuana 3-4 times a week to help her sleep.     Review of Systems   Constitutional:  Negative for chills and fever.   HENT:  Negative for congestion and sore throat.    Eyes:   Negative for blurred vision and double vision.   Respiratory:  Negative for cough and shortness of breath.    Cardiovascular:  Negative for chest pain.   Gastrointestinal:  Negative for abdominal pain and vomiting.   Genitourinary:  Negative for dysuria and hematuria.   Musculoskeletal:  Negative for back pain and neck pain.   Skin:  Negative for rash.   Neurological:  Positive for dizziness and headaches.   All other systems reviewed and are negative.      PAST MEDICAL HISTORY / PROBLEM LIST    Medical History[1]    PAST SURGICAL HISTORY    Surgical History[2]    MEDICATIONS     Discharge Medication List as of 11/26/2023  1:42 PM        CONTINUE these medications which have NOT CHANGED    Details   desogestreL-ethinyl estradioL  (Apri) 0.15-0.03 mg tablet Take 1 tablet by mouth once daily., Starting Thu 11/20/2023, Normal      fluoxetine HCl (PROZAC ORAL) Take by mouth., Historical Med      fLuvoxaMINE (Luvox) 100 mg tablet Take 200 mg by mouth at bedtime., Historical Med              ALLERGIES    Allergies[3]    SOCIAL HISTORY    Social History     Tobacco Use    Smoking status: Never    Smokeless tobacco: Never   Substance  Use Topics    Alcohol use: Yes     Alcohol/week: 2.0 standard drinks of alcohol     Types: 2 Glasses of wine per week     Comment: 2 glasses of wine a week unless going out with friends     Social History     Substance and Sexual Activity   Drug Use Yes    Frequency: 4.0 times per week    Types: Marijuana, Other    Comment: CBD to help me sleep, at night       FAMILY HISTORY    Family History[4]    PHYSICAL EXAM  TRIAGE (FIRST SET) VITAL SIGNS  Temp: 36.1 C (97 F) Temporal  Pulse: 85  BP: 122/83  Resp: 16  SpO2: 100 % Oxygen Therapy: None (Room air)  Glasgow Coma Scale Score: 15    Physical Exam  Constitutional:       General: She is not in acute distress.     Appearance: She is not toxic-appearing.   HENT:      Head: Normocephalic and atraumatic.      Right Ear: External ear normal.       Left Ear: External ear normal.      Nose: Nose normal.      Mouth/Throat:      Mouth: Mucous membranes are moist.      Pharynx: No oropharyngeal exudate.      Comments: Lips dry  Eyes:      General: No visual field deficit.     Extraocular Movements: Extraocular movements intact.      Right eye: No nystagmus.      Left eye: No nystagmus.      Pupils: Pupils are equal, round, and reactive to light.   Cardiovascular:      Rate and Rhythm: Normal rate and regular rhythm.      Heart sounds: Normal heart sounds. No murmur heard.  Pulmonary:      Effort: Pulmonary effort is normal.      Breath sounds: No wheezing.   Abdominal:      General: There is no distension.      Palpations: Abdomen is soft.      Tenderness: There is no abdominal tenderness. There is no guarding or rebound.   Musculoskeletal:         General: No deformity.      Cervical back: No rigidity.   Skin:     Capillary Refill: Capillary refill takes less than 2 seconds.      Findings: No rash.      Comments: Bruise in the left anterior thigh 1x2cm, right pretibial region 2x3 purpleish in color, older faint yellowish bruise in the right anterior thigh, small abrasion mid right pretibial, 3mm abrasion in the left pretibial region   Neurological:      General: No focal deficit present.      Mental Status: She is alert and oriented to person, place, and time.      GCS: GCS eye subscore is 4. GCS verbal subscore is 5. GCS motor subscore is 6.      Cranial Nerves: Cranial nerves 2-12 are intact. No cranial nerve deficit.      Sensory: Sensation is intact.      Motor: Motor function is intact. No weakness or pronator drift.      Coordination: Coordination is intact. Romberg sign negative. Finger-Nose-Finger Test and Heel to [facility] Test normal. Rapid alternating movements normal.      Gait: Gait is  intact.      Deep Tendon Reflexes: Reflexes are normal and symmetric.      Comments: Memory: 3/3 immediate, 3/3 at five minutes   Psychiatric:         Attention and  Perception: She does not perceive auditory or visual hallucinations.      Comments: Quiet, intermittently tremulous         EKG    EKG interpreted by me and shows:    Normal sinus rhythm, no ischemia    LABS    Labs Reviewed   HCG, QUANTITATIVE, PREGNANCY - Normal       Result Value    hCG, serum, quantitative <1      Narrative:     Expected Results:  Adult Female: 0-1 mIU/mL  Non-pregnant Female: 0-4 mIU/mL    If pregnancy is expected, suggest repeat in 24-72 hours.    The concentration of HCG rises rapidly during early pregnancy to a level of 5,000 to 200,000 mIU/mL at 10-12 weeks, followed by a slow decline to levels of 1,000 to 50,000 mIU/mL during the 3rd trimester.    The use of this assay to monitor or diagnose patients with cancer or any other condiion unrelated to pregnancy has not been cleared or approved by the FDA or the manufacturer of the assay.   TSH WITH REFLEX - Normal    TSH 1.31     MAGNESIUM - Normal    Magnesium 2.1     CBC W/DIFF    Narrative:     The following orders were created for panel order CBC and differential.  Procedure                               Abnormality         Status                     ---------                               -----------         ------                     CBC w/ Differential[[id number]]                              Final result                 Please view results for these tests on the individual orders.   COMPREHENSIVE METABOLIC PANEL    Sodium 138      Potassium 4.4      Chloride 106      CO2 (Bicarbonate) 23      Anion Gap 9      BUN 9      Creatinine 0.71      eGFRcr 122      Glucose 97      Fasting? No      Calcium 8.6      AST 22      ALT 13      Alkaline phosphatase 40      Protein, total 6.4      Albumin 3.8      Bilirubin, total 0.3     CBC WITH DIFFERENTIAL    WBC 7.7  RBC 4.09      Hemoglobin 11.9      Hematocrit 36.0      MCV 88.0      MCH 29.1      MCHC 33.1      RDW-CV 13.2      RDW-SD 42.8      Platelets 271      MPV 9.8      Neutrophil % 60.3       Lymphocyte % 33.0      Monocytes % 5.1      Eosinophils % 0.8      Basophils % 0.5      Immature Granulocytes % 0.3      NRBC % 0.0      Neutrophils Absolute 4.62      Lymphocytes Absolute 2.53      Monocytes Absolute 0.39      Eosinophils Absolute 0.06      Basophils Absolute 0.04      Immature Granulocytes Absolute 0.02      NRBC Absolute 0.00          RADIOLOGY    No orders to display       MEDICAL DECISION MAKING & ED COURSE    Medical Decision Making  23 year old female presents for evaluation of fatigue and dizziness. Physical exam was unremarkable and patient is neurologically intact. Labs were within normal limits including a normal WBC, normal Hb, normal TSH. EKG showed normal sinus rhythm. Patient was treated with IVF and she was not orthostatic. She was recommended follow up with her PCP and discharged in stable condition with return precautions.    Problems Addressed:  Dizziness: complicated acute illness or injury  Other fatigue: complicated acute illness or injury    Amount and/or Complexity of Data Reviewed  Labs: ordered. Decision-making details documented in ED Course.  ECG/medicine tests: ordered and independent interpretation performed.      ED Course as of 11/27/23 1258   Wed Nov 26, 2023   1127 Auto WBC: 7.7   1127 Hematocrit: 36.0   1128 Platelets: 271         Diagnoses as of 11/27/23 1258   Dizziness   Other fatigue       CLINICAL IMPRESSION:    1. Dizziness    2. Other fatigue        By signing my name below, I, Kaitlyn Yoon attest that this documentation has been prepared under the direction and presence of Dr. Gwendlyn Gather, MD.     Electronically signed: Kaitlyn Yoon, Scribe. 11/27/23 12:58 PM.    I have personally performed the services described in this documentation, as written by my scribe above in my presence. It is both accurate and complete when signed by me. I have performed any pertinent edits.         [1]   Past Medical History:  Diagnosis Date    Anxiety     Depression     [2] History reviewed. No pertinent surgical history.  [3]   Allergies  Allergen Reactions    Amoxicillin     Radish Rash   [4]   Family History  Problem Relation Name Age of Onset    Depression Mother estranged     Depression Father Lavaeh Bau III     Diabetes Maternal Grandmother      Heart disease Paternal Grandfather Lanaiya Lantry     Breast cancer Neg Hx  Gwendlyn DELENA Gather, MD  11/27/23 1258

## 2023-11-29 NOTE — Assessment & Plan Note (Signed)
 Under gi care

## 2023-11-29 NOTE — Assessment & Plan Note (Deleted)
 Pap, g/c done    Encouraged healthy diet and exercise, safe sex, seatbelts, helmets  Advised 1200 mg of calcium a day- from diet preferably, or from OTC supplement and  Vit D 800 units (either from fortified milk, OJ, foods or from OTC supplement)  Weight bearing exercises encouraged  Encouraged mvi if not getting 3-5 servings of fruits and veggies a day    Checkup 1 year            TDAP: 04/05/23  Encouraged covid shots  Flu shot given

## 2023-11-29 NOTE — Assessment & Plan Note (Signed)
 Scales reviewed  Pt assures me will be following up with pscyeh  Cont current meds    Alert MD if frequency or severity of sx increase prior to next appt

## 2023-11-29 NOTE — Assessment & Plan Note (Signed)
 ap, g/c done    Encouraged healthy diet and exercise, safe sex, seatbelts, helmets  Advised 1200 mg of calcium a day- from diet preferably, or from OTC supplement and  Vit D 800 units (either from fortified milk, OJ, foods or from OTC supplement)  Weight bearing exercises encouraged  Encouraged mvi if not getting 3-5 servings of fruits and veggies a day    Checkup 1 year            TDAP: 04/05/23  Encouraged covid shots  Flu shot given    Options for birth control discussed  Pt interested in ocps  Apri started  How to take this medication, side effects, cautions/concerns, monitoring, dosing reviewed with patient- if questions/concerns arise, alert MD    F/up 3 months  Pt will follow up sooner if clinical concern

## 2023-12-22 NOTE — Telephone Encounter (Signed)
 X1 attempt reach pt for TCM  D.c 11/29  VM full

## 2023-12-23 NOTE — Telephone Encounter (Signed)
 Call placed to patient to follow up after discharge from [facility].    Discharge Date: 11/28    How is the patient feeling?  Overwhelmed. Headache  not well controlled. Advised pt to reach out to neurosurgeon office to discuss pain management.   Out of oxycodone    New meds:  Dexamethasone    Stopped meds:  Ativan, hyroxyzine     Is pt taking the meds as prescribed: YES      Special instructions for care:         NO  VNA: NO   when is VNA visit:NO       specialists needed: Neuro  appt info:  Dec 9     Scheduled patient for a follow up in  (must be 7-14 days from discharge date) Dec 10 Dr Sharlet
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
## 2023-12-30 NOTE — Telephone Encounter (Signed)
 Hello.  Attached you will find an OOMW Referral Order that has been prepared by the Central Referral Team.  Please review the referral and the patient responses to the OOMW Referral Questions.  If this is TMP patient, PCP needs to add explanation as to why patient is not being Redirected at the bottom of the Q&A list on the Additional Order Details page.    We encourage you to call and speak with the patient if you do not feel this referral is appropriate or you wish to redirect the patient.  You are welcome to add information to the document if you choose.  If you agree with referral and content:  Fill out Clinical Question field in the order.  Sign the Referral Order.  Sign the Telephone Encounter  The referral will go automatically to Central Referral Workqueue to be processed and reviewed by [facility] Medical Director.  Thank you!    OOTM Referral Information  Patient is requesting an out of system specialist visit.    1. Explain patient's medical problem: Stitch removal  2. Explain why the patient chose this specific specialist:  Pt was admitted to hospital, 11/23-11/28 for biospy for tumro  3. Has patient seen/spoken with PCP for this problem?  Yes  4. Has patient seen a [facility] specialist for this problem?  No  5. Specialist:   Schuyler Sink, NP  6. Specialist Hospital system?    [facility]          7. Specialist NPI:     [id number]  8. Specialist Fax#:  [phone]  9. Does patient have a past established relationship with this specialist or this practice? No                 10. Estimated last past visit:   11. Number of visits requested:  3                    12. Appointment date for this request:  12/30/2023  13. Any other info:    HP HMO active with Glade [name], NP listed      Document which member of your team has educated patient that:  In the future, this PCP expects that their patients choose a specialist by having a discussion with PCP team first.  Our preferred specialists are part of [facility] [place], [place] and [place].     Nat PLAN Referral Information  Patient is requesting an out of system specialist visit.    1. Explain patient's medical problem: Brain Tumor  2. Explain why the patient chose this specific specialist:  Pt was in the hospital for a biospy of brain tumor   3. Has patient seen/spoken with PCP for this problem?  Yes   4. Has patient seen a [facility] specialist for this problem?  No  5. Specialist:  Toribio Pelt, MD  6. Specialist Hospital system?  [facility]             7. Specialist NPI:   [id number]  8. Specialist Fax#:  [phone]  9. Does patient have a past established relationship with this specialist or this practice?    No              10. Estimated last past visit:   11. Number of visits requested:   6                   12. Appointment date for this  request:  12/30/2023  13. Any other info:    HP HMO active - Glade [name] is listed.     Document which member of your team has educated patient that:  In the future, this PCP expects that their patients choose a specialist by having a discussion with PCP team first.  Our preferred specialists are part of [facility] [place], [place] and [place].  Nat

## 2023-12-31 ENCOUNTER — Ambulatory Visit: Admit: 2023-12-31 | Discharge: 2023-12-31 | Payer: PRIVATE HEALTH INSURANCE

## 2023-12-31 DIAGNOSIS — R200 Anesthesia of skin: Principal | ICD-10-CM

## 2023-12-31 NOTE — Progress Notes (Signed)
 [facility]  [address]  [address] and [address]  [place] [address]  Dept: [phone]  Dept Fax: [phone]     Patient ID: Ellen Herrera is a 23 y.o. female who presents for Follow-up (glioma).    Subjective   History of Present Illness    ADmiitedd to [facility] 11/27/23- 12/12/23 then 12/14/23-12/19/23  The patient presents for evaluation of a brain tumor, psychiatric issues, and right-sided weakness.      PT was sent in for hosp admission by her mental health provider- admitted to [facility]- while there  She experienced a fall due to dizziness while in the hospital for psychiatric evaluation, resulting in a head injury. 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 A biopsy was performed 2 weeks ago, awaiting final path, measuring 1.3 cm x 1.7 cm, with a smaller satellite lesion. Further scans are planned to monitor the tumor's growth. She has been prescribed sumatriptan for migraines but has not yet obtained it. Attempts to manage her headaches with Tylenol  have been unsuccessful. She is currently on steroids, which have been effective in managing her pain and preventing brain swelling. She has been advised to taper her steroid dosage if her headaches become manageable, but to increase the dosage to 4 if her headaches worsen.     She has been referred to an ENT specialist due to difficulty swallowing, which she describes as feeling like she has taken too large a bite of food. Targeted treatment with dabrafenib or encorafenib may be necessary, depending on the progression of her symptoms and tumor growth.     An MRI is scheduled for 01/12/2024.     She has been referred to a fertility specialist due to potential fertility issues related to her potential upcoming treatment,     Consistent numbness on the right side has been present since Sunday, previously intermittent. She reports  difficulty tasting and describes a sensation similar to lidocaine  when swallowing. Occasional twitching, believed to be due to steroids, is noted, along with a sensation of puffiness and weakness on one side of her face.    She was hospitalized on 11/27/2023 due to psychiatric issues, including feelings of being overwhelmed, constant fatigue, dissociative episodes, and memory loss. These symptoms led to suicidal ideation, prompting her to seek help. She was initially scheduled for discharge after a week, but the discovery of the tumor extended her hospital stay by another week. She has been under the care of a psychiatrist and has been attending virtual therapy sessions every Monday. Clonidine has been prescribed for anxiety and insomnia, and her current medications, including Luvox and Abilify, have not been altered. Counseling and a safety plan are in place. She has been living with a roommate since 05/2023 and has been unable to drive due to dizziness. She has been on FMLA leave from work and plans to travel home for the holidays if cleared by her doctor.    During the visit, pt reports she felt like the right arm numbness was getting worse and she felt weaker on that side  Also during the visit, I noted that she started having a right sided facial droop    Sleep: Reports insomnia  Living Condition: Lives with a roommate  Problem List[1]  Current Outpatient Medications   Medication Instructions    acetaminophen  (TYLENOL ) 650 mg, Every 6 hours PRN    ARIPiprazole (ABILIFY) 10  mg, Daily RT    desogestreL-ethinyl estradioL  (Apri ) 0.15-0.03 mg tablet 1 tablet, oral, Daily    dexAMETHasone (DECADRON) 8 mg, Once    fluoxetine HCl (PROZAC ORAL) Take by mouth.    fLuvoxaMINE (LUVOX) 200 mg, Nightly    melatonin 5 mg, Daily PRN    oxyCODONE  (ROXICODONE ) 5 mg, Every 4 hours PRN       Objective   Visit Vitals  BP 90/64 (BP Location: Left arm, Patient Position: Sitting, BP Cuff Size: Adult)   Pulse 113   Temp 36.7 C (98.1  F) (Oral)   Wt 72.2 kg   SpO2 98%   BMI 23.85 kg/m   OB Status Having periods   BSA 1.87 m       Physical Exam  Neurological: Patient exhibits right-sided weakness with decreased strength in the right arm and leg. Notable right-sided facial numbness and tingling, with difficulty tasting on the right side of the tongue. Mild right-sided facial swelling observed. No facial drooping noted. Patient reports consistent numbness on the right side since Sunday.  Mouth/Throat: Patient reports difficulty swallowing at times, feeling like they have to work harder to swallow.  Physical Exam  Constitutional:       Appearance: Normal appearance.   HENT:      Head:      Comments: Right base of skull- hair shaved, scar from bx present- no surrounding erythema, drainage, no warmth     Mouth/Throat:      Mouth: Mucous membranes are moist.      Pharynx: No oropharyngeal exudate or posterior oropharyngeal erythema.   Cardiovascular:      Rate and Rhythm: Normal rate and regular rhythm.      Heart sounds: No murmur heard.  Pulmonary:      Effort: Pulmonary effort is normal.      Breath sounds: Normal breath sounds.   Musculoskeletal:      Cervical back: Normal range of motion and neck supple.      Comments: Nl giat   Neurological:      Mental Status: She is alert and oriented to person, place, and time.      Comments: Pt reports decreased sensation on right side of face and arm      4/5 RUE and RLE strength  5/5 LUE and LLW strength    Pos right sided facial dropp around mouth    Intact reflexes throughout    Otherwise CN 2-12 intact, aside from 7   Psychiatric:         Behavior: Behavior normal.         Thought Content: Thought content normal.      Comments: Mildly anxious affect        Results  Imaging      Assessment/Plan   Assessment & Plan  1. Brain tumor.  Plan as outlined in HPI  Followed by NS at [facility]    2. Psychiatric issues.  - under psyche care    3. Right-sided weakness/and right facial dropp    Interesting right side has  been involved since tumor dx and bx done given on same side as tumor    Unclear if sx worsneing suring our visit d/t a new event, or just because she was getting fatigued (last appt of the session, I was running late, we had a long visit)      D/w pt I think we need to transfer her back to [facility] at once  I called Dr Roann office who  agreed- EMS to take back to [facility] for furtther eval  Nurse has team meeting her in ED    Follow-up: Telehealth visit in January 2026.    Patient Health Questionnaire-9 Score: 15   Interpretation: Positive screening.     Follow-up & Interventions:   - Referral to behavioral health resource.        This visit note was drafted with the help of artificial intelligence. I obtained consent to record the visit from all participants for this purpose.    Harlene [name], MD          [1]   Patient Active Problem List  Diagnosis    Crohn's colitis    Annual physical exam    Severe major depressive disorder    Glioma of brainstem
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
